# Patient Record
Sex: Female | Born: 1990 | Race: Black or African American | Hispanic: No | Marital: Single | Smoking: Never smoker
Health system: Southern US, Community
[De-identification: ages and names within clinical notes are randomized; demographics above are authoritative.]

## PROBLEM LIST (undated history)

## (undated) DIAGNOSIS — E0590 Thyrotoxicosis, unspecified without thyrotoxic crisis or storm: Secondary | ICD-10-CM

## (undated) DIAGNOSIS — F32A Depression, unspecified: Secondary | ICD-10-CM

## (undated) DIAGNOSIS — R519 Headache, unspecified: Secondary | ICD-10-CM

## (undated) HISTORY — DX: Headache, unspecified: R51.9

## (undated) HISTORY — DX: Thyrotoxicosis, unspecified without thyrotoxic crisis or storm: E05.90

---
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
## 2009-10-16 ENCOUNTER — Inpatient Hospital Stay (HOSPITAL_COMMUNITY): Admission: AD | Admit: 2009-10-16 | Discharge: 2009-10-16 | Payer: Self-pay | Admitting: Obstetrics and Gynecology

## 2009-10-16 ENCOUNTER — Ambulatory Visit: Payer: Self-pay | Admitting: Nurse Practitioner

## 2009-10-17 ENCOUNTER — Inpatient Hospital Stay (HOSPITAL_COMMUNITY): Admission: AD | Admit: 2009-10-17 | Discharge: 2009-10-17 | Payer: Self-pay | Admitting: Obstetrics and Gynecology

## 2009-10-17 ENCOUNTER — Ambulatory Visit: Payer: Self-pay | Admitting: Obstetrics and Gynecology

## 2010-05-17 ENCOUNTER — Encounter (HOSPITAL_COMMUNITY): Payer: Self-pay | Admitting: Obstetrics and Gynecology

## 2010-05-17 ENCOUNTER — Inpatient Hospital Stay (HOSPITAL_COMMUNITY)
Admission: RE | Admit: 2010-05-17 | Discharge: 2010-05-19 | DRG: 775 | Disposition: A | Discharge status: Home / Self Care | Payer: 59 | Source: Ambulatory Visit | Attending: Obstetrics and Gynecology | Admitting: Obstetrics and Gynecology

## 2010-05-17 DIAGNOSIS — D649 Anemia, unspecified: Secondary | ICD-10-CM | POA: Diagnosis not present

## 2010-05-17 DIAGNOSIS — O9903 Anemia complicating the puerperium: Secondary | ICD-10-CM | POA: Diagnosis not present

## 2010-05-17 LAB — CBC
HCT: 33.8 % — ABNORMAL LOW (ref 36.0–46.0)
Hemoglobin: 11.2 g/dL — ABNORMAL LOW (ref 12.0–15.0)
MCV: 86 fL (ref 78.0–100.0)
Platelets: 226 10*3/uL (ref 150–400)
RBC: 3.93 MIL/uL (ref 3.87–5.11)
WBC: 10.1 10*3/uL (ref 4.0–10.5)

## 2010-05-18 LAB — CBC
HCT: 27.5 % — ABNORMAL LOW (ref 36.0–46.0)
Hemoglobin: 8.9 g/dL — ABNORMAL LOW (ref 12.0–15.0)
MCH: 28.1 pg (ref 26.0–34.0)
MCHC: 32.4 g/dL (ref 30.0–36.0)
MCV: 86.8 fL (ref 78.0–100.0)
Platelets: 196 10*3/uL (ref 150–400)
RBC: 3.17 MIL/uL — ABNORMAL LOW (ref 3.87–5.11)
WBC: 11.2 10*3/uL — ABNORMAL HIGH (ref 4.0–10.5)

## 2010-05-29 NOTE — H&P (Signed)
  NAMEALISSE, [name] NO.:  [id number]  MEDICAL RECORD NO.:  [id number]           PATIENT TYPE:  I  LOCATION:  9162                          FACILITY:  [facility]  PHYSICIAN:  Janine Limbo, M.D.DATE OF BIRTH:  November 13, 1990  DATE OF ADMISSION:  05/17/2010 DATE OF DISCHARGE:                             HISTORY & PHYSICAL   HISTORY OF PRESENT ILLNESS:  [name] is a 20 year old female, gravida 1, para 0, who presents at 40 weeks and 5 days gestation ([facility] is May 12, 2010).  The patient has been followed at the [facility] of [facility] for women.  Her pregnancy has been largely uncomplicated.  She does have a history of pyelonephritis but has done well during this pregnancy.  Her third trimester beta strep test was positive.  DRUG ALLERGIES:  No known drug allergies.  PAST MEDICAL HISTORY:  The patient has a history of pyelonephritis as mentioned above.  She has done well during this pregnancy.  She was treated at the [facility] in July 2011, with IV antibiotics.  The patient was told that she had asthma as a child but has had no problems as an adult.  SOCIAL HISTORY:  The patient denies cigarette use, alcohol use, and recreational drug use.  REVIEW OF SYSTEMS:  Normal pregnancy complaints.  FAMILY HISTORY:  Noncontributory.  PHYSICAL EXAMINATION:  VITAL SIGNS:  Weight is 161 pounds, height is 5 feet 2 inches. HEENT:  Within normal limits. CHEST:  Clear. HEART:  Regular rate and rhythm. BREASTS:  Without masses. ABDOMEN:  Gravid with a fundal height of 40 cm. EXTREMITIES:  Grossly normal. NEUROLOGIC:  Grossly normal. PELVIC:  The cervix is 3-cm dilated, 75% effaced, and 0 to -1 in station.  LABORATORY VALUES:  Blood type is A positive, antibody screen negative, sickle cell negative, VDRL nonreactive, rubella immune, HbsAg negative, HIV nonreactive, GC negative, Chlamydia negative.   First trimester screen is within normal limits.  Third trimester beta strep is positive. Third trimester gonorrhea negative.  Third trimester Chlamydia is negative.  ASSESSMENT: 1. A 40-week and 5-day gestation. 2. Positive vaginal beta strep.  PLAN:  The patient has elected to proceed with induction at this time. She understands the indications and the risk associated with her induction protocol.  We will treat the patient with penicillin because of her positive beta strep test.     Janine Limbo, M.D.     AVS/MEDQ  D:  05/16/2010  T:  05/17/2010  Job:  [id number]  Electronically Signed by Kirkland Hun M.D. on 05/29/2010 11:00:19 AM

## 2010-06-18 LAB — DIFFERENTIAL
Basophils Absolute: 0 10*3/uL (ref 0.0–0.1)
Basophils Relative: 0 % (ref 0–1)
Eosinophils Absolute: 0 10*3/uL (ref 0.0–0.7)
Eosinophils Relative: 0 % (ref 0–5)
Lymphocytes Relative: 18 % (ref 12–46)
Lymphs Abs: 1.8 10*3/uL (ref 0.7–4.0)
Monocytes Absolute: 0.6 10*3/uL (ref 0.1–1.0)
Monocytes Relative: 6 % (ref 3–12)
Neutro Abs: 7.4 10*3/uL (ref 1.7–7.7)
Neutrophils Relative %: 75 % (ref 43–77)

## 2010-06-18 LAB — URINE MICROSCOPIC-ADD ON

## 2010-06-18 LAB — URINALYSIS, ROUTINE W REFLEX MICROSCOPIC
Bilirubin Urine: NEGATIVE
Glucose, UA: NEGATIVE mg/dL
Ketones, ur: NEGATIVE mg/dL
Nitrite: POSITIVE — AB
Protein, ur: 30 mg/dL — AB
Specific Gravity, Urine: 1.025 (ref 1.005–1.030)
Urobilinogen, UA: 0.2 mg/dL (ref 0.0–1.0)
pH: 6 (ref 5.0–8.0)

## 2010-06-18 LAB — BASIC METABOLIC PANEL
BUN: 5 mg/dL — ABNORMAL LOW (ref 6–23)
CO2: 24 mEq/L (ref 19–32)
Calcium: 10.3 mg/dL (ref 8.4–10.5)
Chloride: 101 mEq/L (ref 96–112)
Creatinine, Ser: 0.33 mg/dL — ABNORMAL LOW (ref 0.4–1.2)
GFR calc Af Amer: >60 mL/min (ref >60)
GFR calc non Af Amer: >60 mL/min (ref >60)
Glucose, Bld: 93 mg/dL (ref 70–99)
Potassium: 3.4 mEq/L — ABNORMAL LOW (ref 3.5–5.1)
Sodium: 132 mEq/L — ABNORMAL LOW (ref 135–145)

## 2010-06-18 LAB — CBC
HCT: 38.8 % (ref 36.0–46.0)
Hemoglobin: 13.2 g/dL (ref 12.0–15.0)
MCH: 30 pg (ref 26.0–34.0)
MCHC: 33.9 g/dL (ref 30.0–36.0)
MCV: 88.4 fL (ref 78.0–100.0)
Platelets: 237 10*3/uL (ref 150–400)
RBC: 4.39 MIL/uL (ref 3.87–5.11)
RDW: 14.1 % (ref 11.5–15.5)
WBC: 9.9 10*3/uL (ref 4.0–10.5)

## 2011-08-15 ENCOUNTER — Encounter: Payer: Self-pay | Admitting: Obstetrics and Gynecology

## 2012-04-03 HISTORY — PX: OTHER SURGICAL HISTORY: SHX169

## 2014-02-02 ENCOUNTER — Encounter (HOSPITAL_COMMUNITY): Payer: Self-pay | Admitting: Obstetrics and Gynecology

## 2017-11-27 DIAGNOSIS — I10 Essential (primary) hypertension: Secondary | ICD-10-CM | POA: Diagnosis not present

## 2018-01-01 DIAGNOSIS — E559 Vitamin D deficiency, unspecified: Secondary | ICD-10-CM | POA: Diagnosis not present

## 2018-01-01 DIAGNOSIS — I10 Essential (primary) hypertension: Secondary | ICD-10-CM | POA: Diagnosis not present

## 2018-01-01 DIAGNOSIS — Z23 Encounter for immunization: Secondary | ICD-10-CM | POA: Diagnosis not present

## 2018-01-01 DIAGNOSIS — Z0000 Encounter for general adult medical examination without abnormal findings: Secondary | ICD-10-CM | POA: Diagnosis not present

## 2018-05-22 DIAGNOSIS — I10 Essential (primary) hypertension: Secondary | ICD-10-CM | POA: Diagnosis not present

## 2018-07-17 DIAGNOSIS — Z23 Encounter for immunization: Secondary | ICD-10-CM | POA: Diagnosis not present

## 2018-08-14 DIAGNOSIS — Z23 Encounter for immunization: Secondary | ICD-10-CM | POA: Diagnosis not present

## 2018-11-13 DIAGNOSIS — F3341 Major depressive disorder, recurrent, in partial remission: Secondary | ICD-10-CM | POA: Diagnosis not present

## 2018-11-13 DIAGNOSIS — E559 Vitamin D deficiency, unspecified: Secondary | ICD-10-CM | POA: Diagnosis not present

## 2018-11-13 DIAGNOSIS — I10 Essential (primary) hypertension: Secondary | ICD-10-CM | POA: Diagnosis not present

## 2018-11-28 DIAGNOSIS — U071 COVID-19: Secondary | ICD-10-CM | POA: Diagnosis not present

## 2018-12-05 DIAGNOSIS — U071 COVID-19: Secondary | ICD-10-CM | POA: Diagnosis not present

## 2018-12-11 DIAGNOSIS — U071 COVID-19: Secondary | ICD-10-CM | POA: Diagnosis not present

## 2018-12-19 DIAGNOSIS — U071 COVID-19: Secondary | ICD-10-CM | POA: Diagnosis not present

## 2019-01-02 DIAGNOSIS — U071 COVID-19: Secondary | ICD-10-CM | POA: Diagnosis not present

## 2019-01-02 DIAGNOSIS — Z20828 Contact with and (suspected) exposure to other viral communicable diseases: Secondary | ICD-10-CM | POA: Diagnosis not present

## 2019-01-08 DIAGNOSIS — Z20828 Contact with and (suspected) exposure to other viral communicable diseases: Secondary | ICD-10-CM | POA: Diagnosis not present

## 2019-01-15 DIAGNOSIS — Z20828 Contact with and (suspected) exposure to other viral communicable diseases: Secondary | ICD-10-CM | POA: Diagnosis not present

## 2019-01-15 DIAGNOSIS — U071 COVID-19: Secondary | ICD-10-CM | POA: Diagnosis not present

## 2019-01-23 DIAGNOSIS — Z20828 Contact with and (suspected) exposure to other viral communicable diseases: Secondary | ICD-10-CM | POA: Diagnosis not present

## 2019-01-23 DIAGNOSIS — U071 COVID-19: Secondary | ICD-10-CM | POA: Diagnosis not present

## 2019-01-30 DIAGNOSIS — U071 COVID-19: Secondary | ICD-10-CM | POA: Diagnosis not present

## 2019-01-30 DIAGNOSIS — Z20828 Contact with and (suspected) exposure to other viral communicable diseases: Secondary | ICD-10-CM | POA: Diagnosis not present

## 2019-02-06 DIAGNOSIS — Z20828 Contact with and (suspected) exposure to other viral communicable diseases: Secondary | ICD-10-CM | POA: Diagnosis not present

## 2019-02-13 DIAGNOSIS — Z20828 Contact with and (suspected) exposure to other viral communicable diseases: Secondary | ICD-10-CM | POA: Diagnosis not present

## 2019-02-13 DIAGNOSIS — U071 COVID-19: Secondary | ICD-10-CM | POA: Diagnosis not present

## 2019-02-17 DIAGNOSIS — U071 COVID-19: Secondary | ICD-10-CM | POA: Diagnosis not present

## 2019-02-17 DIAGNOSIS — Z20828 Contact with and (suspected) exposure to other viral communicable diseases: Secondary | ICD-10-CM | POA: Diagnosis not present

## 2019-02-24 DIAGNOSIS — U071 COVID-19: Secondary | ICD-10-CM | POA: Diagnosis not present

## 2019-02-24 DIAGNOSIS — Z20828 Contact with and (suspected) exposure to other viral communicable diseases: Secondary | ICD-10-CM | POA: Diagnosis not present

## 2019-03-03 DIAGNOSIS — U071 COVID-19: Secondary | ICD-10-CM | POA: Diagnosis not present

## 2019-03-03 DIAGNOSIS — Z20828 Contact with and (suspected) exposure to other viral communicable diseases: Secondary | ICD-10-CM | POA: Diagnosis not present

## 2019-03-04 DIAGNOSIS — I10 Essential (primary) hypertension: Secondary | ICD-10-CM | POA: Diagnosis not present

## 2019-03-04 DIAGNOSIS — F3341 Major depressive disorder, recurrent, in partial remission: Secondary | ICD-10-CM | POA: Diagnosis not present

## 2019-03-04 DIAGNOSIS — E559 Vitamin D deficiency, unspecified: Secondary | ICD-10-CM | POA: Diagnosis not present

## 2019-03-07 DIAGNOSIS — I10 Essential (primary) hypertension: Secondary | ICD-10-CM | POA: Diagnosis not present

## 2019-03-07 DIAGNOSIS — E559 Vitamin D deficiency, unspecified: Secondary | ICD-10-CM | POA: Diagnosis not present

## 2019-03-10 DIAGNOSIS — U071 COVID-19: Secondary | ICD-10-CM | POA: Diagnosis not present

## 2019-03-10 DIAGNOSIS — Z20828 Contact with and (suspected) exposure to other viral communicable diseases: Secondary | ICD-10-CM | POA: Diagnosis not present

## 2019-03-17 DIAGNOSIS — U071 COVID-19: Secondary | ICD-10-CM | POA: Diagnosis not present

## 2019-03-17 DIAGNOSIS — Z20828 Contact with and (suspected) exposure to other viral communicable diseases: Secondary | ICD-10-CM | POA: Diagnosis not present

## 2019-03-24 DIAGNOSIS — Z20828 Contact with and (suspected) exposure to other viral communicable diseases: Secondary | ICD-10-CM | POA: Diagnosis not present

## 2019-03-24 DIAGNOSIS — U071 COVID-19: Secondary | ICD-10-CM | POA: Diagnosis not present

## 2020-10-05 DIAGNOSIS — E0591 Thyrotoxicosis, unspecified with thyrotoxic crisis or storm: Secondary | ICD-10-CM

## 2020-10-05 HISTORY — DX: Thyrotoxicosis, unspecified with thyrotoxic crisis or storm: E05.91

## 2020-10-05 LAB — OB RESULTS CONSOLE HEPATITIS B SURFACE ANTIGEN: Hepatitis B Surface Ag: NEGATIVE

## 2020-10-05 LAB — HEPATITIS C ANTIBODY: HCV Ab: NEGATIVE

## 2020-10-05 LAB — OB RESULTS CONSOLE RUBELLA ANTIBODY, IGM: Rubella: IMMUNE

## 2020-10-05 LAB — OB RESULTS CONSOLE ABO/RH: RH Type: POSITIVE

## 2020-10-05 LAB — OB RESULTS CONSOLE ANTIBODY SCREEN: Antibody Screen: NEGATIVE

## 2020-10-05 LAB — OB RESULTS CONSOLE HIV ANTIBODY (ROUTINE TESTING): HIV: NONREACTIVE

## 2020-10-05 LAB — OB RESULTS CONSOLE RPR: RPR: NONREACTIVE

## 2020-12-14 ENCOUNTER — Other Ambulatory Visit: Payer: Self-pay | Admitting: Gastroenterology

## 2020-12-14 DIAGNOSIS — R7989 Other specified abnormal findings of blood chemistry: Secondary | ICD-10-CM

## 2020-12-16 ENCOUNTER — Ambulatory Visit
Admission: RE | Admit: 2020-12-16 | Discharge: 2020-12-16 | Disposition: A | Discharge status: Home / Self Care | Payer: Self-pay | Source: Ambulatory Visit | Attending: Gastroenterology | Admitting: Gastroenterology

## 2020-12-16 ENCOUNTER — Ambulatory Visit (INDEPENDENT_AMBULATORY_CARE_PROVIDER_SITE_OTHER): Payer: 59 | Admitting: Cardiovascular Disease

## 2020-12-16 ENCOUNTER — Other Ambulatory Visit: Payer: Self-pay

## 2020-12-16 ENCOUNTER — Encounter (HOSPITAL_BASED_OUTPATIENT_CLINIC_OR_DEPARTMENT_OTHER): Payer: Self-pay | Admitting: Cardiovascular Disease

## 2020-12-16 DIAGNOSIS — Z331 Pregnant state, incidental: Secondary | ICD-10-CM

## 2020-12-16 DIAGNOSIS — R7989 Other specified abnormal findings of blood chemistry: Secondary | ICD-10-CM

## 2020-12-16 DIAGNOSIS — I10 Essential (primary) hypertension: Secondary | ICD-10-CM | POA: Diagnosis not present

## 2020-12-16 DIAGNOSIS — Z3490 Encounter for supervision of normal pregnancy, unspecified, unspecified trimester: Secondary | ICD-10-CM

## 2020-12-16 HISTORY — DX: Essential (primary) hypertension: I10

## 2020-12-16 HISTORY — DX: Encounter for supervision of normal pregnancy, unspecified, unspecified trimester: Z34.90

## 2020-12-16 NOTE — Progress Notes (Signed)
Advanced Hypertension Clinic Initial Assessment:    Date:  12/16/2020   ID:  [name], DOB 07/14/1990, MRN [id number]  PCP:  Soundra Pilon, FNP  Cardiologist:  None  Nephrologist:  Referring MD: Maxie Better, MD   CC: Hypertension  History of Present Illness:    [name] is a 30 y.o. female with a hx of hypertension in pregnancy, here to establish care in the Advanced Hypertension Clinic. She saw Dr. Cherly Hensen 10/2020 and her blood pressure was 110/74. She was on propranolol and amlodipine at that time.  Today, she is 5 months pregnant and due 05/03/2021. Her issues with blood pressure began 2-3 years ago. She was started on 10mg  amlodipine and 80mg  propranolol. Propranolol was then increased to 120 mg. She denies blood pressure issues during her first pregnancy. Since the third month of her current pregnancy, she has not been taking blood pressure medication. When she did take her medication she felt like her blood pressure would bottom out. Lately her blood pressure has been 110 systolic at the highest, and diastolic pressure is typically in the 60s. As of last week, she has new issues with lightheadedness/dizziness. During these episodes her heart rate is in the 100s-110s. At one time she felt near-syncopal but notes being overheated. She endorses mild LE edema that dissipates with LE elevation.  She notes some labored breathing walking up stairs, but nothing she considers abnormal considering her pregnancy. She works as a in and is usually active. Recently she has been ordering out more often. Previously she normally cooked meals at home and did monitor her salt intake. Typically drinks caffeinated beverages twice a week, no alcohol recently. No supplements or herbs, and she does not take pain medication while she has been pregnant. Otherwise she would take Aleve once a month for pain management. In 10/2020 Dr. Editor, commissioning noticed a goiter. Her TSH was in normal range.  Liver function labs were elevated. She has a GI consult after this appointment. She denies any palpitations, chest pain, headaches, orthopnea, or PND.   Previous antihypertensives: Amlodipine Propranolol   Past Medical History:  Diagnosis Date   Essential hypertension 12/16/2020   Pregnancy 12/16/2020    History reviewed. No pertinent surgical history.  Current Medications: Current Meds  Medication Sig   aspirin EC 81 MG tablet Take 81 mg by mouth daily. Swallow whole.   Prenatal Vit-Fe Fumarate-FA (PRENATAL MULTIVITAMIN) TABS tablet Take 1 tablet by mouth daily.     Allergies:   Patient has no allergy information on record.   Social History   Socioeconomic History   Marital status: Single    Spouse name: Not on file   Number of children: Not on file   Years of education: Not on file   Highest education level: Not on file  Occupational History   Not on file  Tobacco Use   Smoking status: Never   Smokeless tobacco: Never  Substance and Sexual Activity   Alcohol use: Never   Drug use: Never   Sexual activity: Not on file  Other Topics Concern   Not on file  Social History Narrative   Not on file   Social Determinants of Health   Financial Resource Strain: Low Risk    Difficulty of Paying Living Expenses: Not hard at all  Food Insecurity: No Food Insecurity   Worried About Running Out of Food in the Last Year: Never true   Ran Out of Food in the Last Year: Never true  Transportation Needs: No Regulatory affairs officer (Medical): No   Lack of Transportation (Non-Medical): No  Physical Activity: Not on file  Stress: Not on file  Social Connections: Not on file     Family History: The patient's family history includes Heart failure in her paternal grandmother; Hypertension in her father, maternal grandmother, and paternal grandmother; Stroke in her paternal grandmother.  ROS:   Please see the history of present illness.    (+)  Lightheadedness (+) Near-syncope (+) Shortness of breath (+) Bilateral LE edema All other systems reviewed and are negative.  EKGs/Labs/Other Studies Reviewed:    EKG:   12/16/2020: Sinus rhythm. Rate 77 bpm.  Recent Labs: No results found for requested labs within last 8760 hours.   Recent Lipid Panel No results found for: CHOL, TRIG, HDL, CHOLHDL, VLDL, LDLCALC, LDLDIRECT  Physical Exam:   VS:  BP 106/70   Pulse 77   Wt 170 lb (77.1 kg)  , BMI There is no height or weight on file to calculate BMI. GENERAL:  Well appearing HEENT: Pupils equal round and reactive, fundi not visualized, oral mucosa unremarkable NECK:  No jugular venous distention, waveform within normal limits, carotid upstroke brisk and symmetric, no bruits, + thyromegaly LUNGS:  Clear to auscultation bilaterally HEART:  RRR.  PMI not displaced or sustained,S1 and S2 within normal limits, no S3, no S4, no clicks, no rubs, no murmurs ABD:  Gravid uterus.  Positive bowel sounds normal in frequency in pitch, no bruits, no rebound, no guarding, no midline pulsatile mass, no hepatomegaly, no splenomegaly EXT:  2 plus pulses throughout, no edema, no cyanosis no clubbing SKIN:  No rashes no nodules NEURO:  Cranial nerves II through XII grossly intact, motor grossly intact throughout PSYCH:  Cognitively intact, oriented to person place and time   ASSESSMENT/PLAN:    Essential hypertension Blood pressure is currently well-controlled.  She was diagnosed with hypertension in her late 101s and was well-controlled on amlodipine and propranolol.  Since becoming pregnant she has not needed any medications and her blood pressure has remained low.  She is at risk of increasing during the third trimester and developing preeclampsia.  She is already appropriately on aspirin 81 mg.  She will continue to track her blood pressures at home.  Continue to limit sodium intake.  We will see her again in the third trimester to ensure that she  is stable and she understands she can call us anytime if her blood pressure starts to increase.  Given that she developed hypertension at such a young age, she should get a work-up for secondary causes, though very likely may be familial.  Once she delivers we will check renal artery Dopplers.  We can assess for hyperaldosteronism at that time as well.  She reportedly had thyroid function testing that was normal with Dr. Cherly Hensen in July.  Pregnancy 5 months   Screening for Secondary Hypertension:  Causes 12/16/2020  Drugs/Herbals Screened     - Comments Rare Aleve when not pregnant  Renovascular HTN Not Screened  Sleep Apnea N/A  Thyroid Disease Screened     - Comments TSH normal 10/2020  Hyperaldosteronism Not Screened  Pheochromocytoma N/A  Cushing's Syndrome N/A  Hyperparathyroidism N/A  Coarctation of the Aorta Screened     - Comments BP symmetric  Compliance Screened    Relevant Labs/Studies: Basic Labs Latest Ref Rng & Units 10/16/2009  Sodium 135 - 145 mEq/L 132(L)  Potassium 3.5 - 5.1 mEq/L 3.4(L)  Creatinine 0.4 - 1.2 mg/dL 4.01(U)                    Disposition:    FU with Ishaaq Penna C. Duke Salvia, MD, [facility] in 2-3 months.   Medication Adjustments/Labs and Tests Ordered: Current medicines are reviewed at length with the patient today.  Concerns regarding medicines are outlined above.  Orders Placed This Encounter  Procedures   EKG 12-Lead    No orders of the defined types were placed in this encounter.  I,Mathew Stumpf,acting as a Neurosurgeon for Chilton Si, MD.,have documented all relevant documentation on the behalf of Chilton Si, MD,as directed by  Chilton Si, MD while in the presence of Chilton Si, MD.  I, Loel Betancur C. Duke Salvia, MD have reviewed all documentation for this visit.  The documentation of the exam, diagnosis, procedures, and orders on 12/16/2020 are all accurate and complete.   Guadalupe Maple  12/16/2020 8:47 AM    [facility]

## 2020-12-16 NOTE — Assessment & Plan Note (Addendum)
Blood pressure is currently well-controlled.  She was diagnosed with hypertension in her late 64s and was well-controlled on amlodipine and propranolol.  Since becoming pregnant she has not needed any medications and her blood pressure has remained low.  She is at risk of increasing during the third trimester and developing preeclampsia.  She is already appropriately on aspirin 81 mg.  She will continue to track her blood pressures at home.  Continue to limit sodium intake.  We will see her again in the third trimester to ensure that she is stable and she understands she can call us anytime if her blood pressure starts to increase.  Given that she developed hypertension at such a young age, she should get a work-up for secondary causes, though very likely may be familial.  Once she delivers we will check renal artery Dopplers.  We can assess for hyperaldosteronism at that time as well.  She reportedly had thyroid function testing that was normal with Dr. Cherly Hensen in July.  Her BP goal is <140/90.

## 2020-12-16 NOTE — Patient Instructions (Addendum)
Medication Instructions:  Your physician recommends that you continue on your current medications as directed. Please refer to the Current Medication list given to you today.   Labwork: NONE    Testing/Procedures: NONE   Follow-Up: 02/15/2021 8:00 AM WITH DR [place] AT [facility]

## 2020-12-16 NOTE — Assessment & Plan Note (Addendum)
5 months.  Doing well.   She is due to have a baby in January. No issues with her previous pregnancy 10 years ago.

## 2021-02-14 NOTE — Progress Notes (Incomplete)
Advanced Hypertension Clinic Initial Assessment:    Date:  02/15/2021   ID:  [name], DOB 1990/07/07, MRN [id number]  PCP:  Soundra Pilon, FNP  Cardiologist:  None  Nephrologist:  Referring MD: Soundra Pilon, FNP   CC: Hypertension  History of Present Illness:    [name] is a 30 y.o. female with a hx of hypertension in pregnancy, here to follow-up in the Advanced Hypertension Clinic.   She saw Dr. Cherly Hensen 10/2020 and her blood pressure was 110/74. She was on propranolol and amlodipine at that time.  At her last visit, she was 5 months pregnant and due 05/03/2021. Her issues with blood pressure began 2-3 years ago. She was started on 10mg  amlodipine and 80mg  propranolol which was then increased to 120 mg. She denied blood pressure issues during her first pregnancy. Since the third month of her current pregnancy, she had not been taking blood pressure medication. When she did take her medication she felt like her blood pressure would bottom out. Her blood pressure had been 110 systolic at the highest, and diastolic pressure is typically in the 60s. She reported new issues with lightheadedness/dizziness with her heart rate increasing to 100s-110s. At one time she felt near-syncopal but notes being overheated. In 10/2020 Dr. noticed a goiter. Her TSH was in normal range. Liver function labs were elevated.   Today, she  She denies any palpitations, chest pain, or shortness of breath, lightheadedness, headaches, syncope, orthopnea, PND, lower extremity edema or exertional symptoms.  Previous antihypertensives: Amlodipine Propranolol   Past Medical History:  Diagnosis Date   Essential hypertension 12/16/2020   Pregnancy 12/16/2020    No past surgical history on file.  Current Medications: No outpatient medications have been marked as taking for the 02/15/21 encounter (Appointment) with 12/18/2020, MD.     Allergies:   Patient has no allergy information on  record.   Social History   Socioeconomic History   Marital status: Single    Spouse name: Not on file   Number of children: Not on file   Years of education: Not on file   Highest education level: Not on file  Occupational History   Not on file  Tobacco Use   Smoking status: Never   Smokeless tobacco: Never  Substance and Sexual Activity   Alcohol use: Never   Drug use: Never   Sexual activity: Not on file  Other Topics Concern   Not on file  Social History Narrative   Not on file   Social Determinants of Health   Financial Resource Strain: Low Risk    Difficulty of Paying Living Expenses: Not hard at all  Food Insecurity: No Food Insecurity   Worried About Running Out of Food in the Last Year: Never true   Ran Out of Food in the Last Year: Never true  Transportation Needs: No Transportation Needs   Lack of Transportation (Medical): No   Lack of Transportation (Non-Medical): No  Physical Activity: Not on file  Stress: Not on file  Social Connections: Not on file     Family History: The patient's family history includes Heart failure in her paternal grandmother; Hypertension in her father, maternal grandmother, and paternal grandmother; Stroke in her paternal grandmother.  ROS:   Please see the history of present illness.    All other systems reviewed and are negative.  EKGs/Labs/Other Studies Reviewed:    EKG:   02/14/21: Sinus ***, rate *** bpm 12/16/2020: Sinus rhythm. Rate 77  bpm.  Recent Labs: No results found for requested labs within last 8760 hours.   Recent Lipid Panel No results found for: CHOL, TRIG, HDL, CHOLHDL, VLDL, LDLCALC, LDLDIRECT  Physical Exam:   VS:  There were no vitals taken for this visit. , BMI There is no height or weight on file to calculate BMI. GENERAL:  Well appearing HEENT: Pupils equal round and reactive, fundi not visualized, oral mucosa unremarkable NECK:  No jugular venous distention, waveform within normal limits,  carotid upstroke brisk and symmetric, no bruits, *** + thyromegaly LUNGS:  Clear to auscultation bilaterally HEART:  RRR.  PMI not displaced or sustained,S1 and S2 within normal limits, no S3, no S4, no clicks, no rubs, no murmurs ABD:  Gravid uterus.  Positive bowel sounds normal in frequency in pitch, no bruits, no rebound, no guarding, no midline pulsatile mass, no hepatomegaly, no splenomegaly EXT:  2 plus pulses throughout, no edema, no cyanosis no clubbing SKIN:  No rashes no nodules NEURO:  Cranial nerves II through XII grossly intact, motor grossly intact throughout PSYCH:  Cognitively intact, oriented to person place and time   ASSESSMENT/PLAN:    No problem-specific Assessment & Plan notes found for this encounter.   Screening for Secondary Hypertension:  Causes 12/16/2020  Drugs/Herbals Screened     - Comments Rare Aleve when not pregnant  Renovascular HTN Not Screened  Sleep Apnea N/A  Thyroid Disease Screened     - Comments TSH normal 10/2020  Hyperaldosteronism Not Screened  Pheochromocytoma N/A  Cushing's Syndrome N/A  Hyperparathyroidism N/A  Coarctation of the Aorta Screened     - Comments BP symmetric  Compliance Screened    Relevant Labs/Studies: Basic Labs Latest Ref Rng & Units 10/16/2009  Sodium 135 - 145 mEq/L 132(L)  Potassium 3.5 - 5.1 mEq/L 3.4(L)  Creatinine 0.4 - 1.2 mg/dL 2.84(X)                    Disposition:    FU with Tiffany C. Duke Salvia, MD, [facility] in *** months.   Medication Adjustments/Labs and Tests Ordered: Current medicines are reviewed at length with the patient today.  Concerns regarding medicines are outlined above.  No orders of the defined types were placed in this encounter.   No orders of the defined types were placed in this encounter.   I,Mykaella Javier,acting as a scribe for Chilton Si, MD.,have documented all relevant documentation on the behalf of Chilton Si, MD,as directed by  Chilton Si, MD while  in the presence of Chilton Si, MD.  ***  Signed, Pieter Partridge  02/15/2021 7:47 AM    [facility]

## 2021-02-15 ENCOUNTER — Ambulatory Visit (HOSPITAL_BASED_OUTPATIENT_CLINIC_OR_DEPARTMENT_OTHER): Payer: 59 | Admitting: Cardiovascular Disease

## 2021-03-31 LAB — OB RESULTS CONSOLE GBS: GBS: NEGATIVE

## 2021-04-03 NOTE — L&D Delivery Note (Signed)
Delivery Note At 6:23 PM a viable and healthy female was delivered via  (Presentation:  vtx/OA    ).  APGAR: 8, 9; weight  pending.   Placenta status: Spontaneous, Intact.  Not sent. Cord:  CAN x1 reducible . Marginal cord 3 vessels with the following complications: None.  Cord pH: n/a  Anesthesia: Epidural Episiotomy: None Lacerations: None Suture Repair:  n/a Est. Blood Loss (mL):   Mom to postpartum.  Baby to Couplet care / Skin to Skin.  Keanon Bevins A Kazi Montoro 04/25/2021, 6:50 PM

## 2021-04-20 ENCOUNTER — Telehealth (HOSPITAL_COMMUNITY): Payer: Self-pay | Admitting: *Deleted

## 2021-04-20 ENCOUNTER — Encounter (HOSPITAL_COMMUNITY): Payer: Self-pay | Admitting: *Deleted

## 2021-04-20 NOTE — Telephone Encounter (Signed)
Preadmission screen  

## 2021-04-21 ENCOUNTER — Encounter (HOSPITAL_COMMUNITY): Payer: Self-pay | Admitting: *Deleted

## 2021-04-25 ENCOUNTER — Inpatient Hospital Stay (HOSPITAL_COMMUNITY): Payer: Commercial Managed Care - HMO | Admitting: Anesthesiology

## 2021-04-25 ENCOUNTER — Encounter (HOSPITAL_COMMUNITY): Payer: Self-pay | Admitting: Obstetrics and Gynecology

## 2021-04-25 ENCOUNTER — Inpatient Hospital Stay (HOSPITAL_COMMUNITY)
Admission: AD | Admit: 2021-04-25 | Discharge: 2021-04-27 | DRG: 807 | Disposition: A | Discharge status: Home / Self Care | Payer: Commercial Managed Care - HMO | Attending: Obstetrics and Gynecology | Admitting: Obstetrics and Gynecology

## 2021-04-25 ENCOUNTER — Other Ambulatory Visit: Payer: Self-pay

## 2021-04-25 DIAGNOSIS — O10013 Pre-existing essential hypertension complicating pregnancy, third trimester: Secondary | ICD-10-CM

## 2021-04-25 DIAGNOSIS — Z20822 Contact with and (suspected) exposure to covid-19: Secondary | ICD-10-CM | POA: Diagnosis present

## 2021-04-25 DIAGNOSIS — O114 Pre-existing hypertension with pre-eclampsia, complicating childbirth: Secondary | ICD-10-CM | POA: Diagnosis present

## 2021-04-25 DIAGNOSIS — O1002 Pre-existing essential hypertension complicating childbirth: Secondary | ICD-10-CM | POA: Diagnosis present

## 2021-04-25 DIAGNOSIS — Z3A38 38 weeks gestation of pregnancy: Secondary | ICD-10-CM | POA: Diagnosis not present

## 2021-04-25 HISTORY — DX: Pre-existing essential hypertension complicating pregnancy, third trimester: O10.013

## 2021-04-25 LAB — PROTEIN / CREATININE RATIO, URINE
Creatinine, Urine: 14.14 mg/dL
Total Protein, Urine: <6 mg/dL

## 2021-04-25 LAB — BASIC METABOLIC PANEL
Anion gap: 9 (ref 5–15)
BUN: 6 mg/dL (ref 6–20)
CO2: 20 mmol/L — ABNORMAL LOW (ref 22–32)
Calcium: 9.3 mg/dL (ref 8.9–10.3)
Chloride: 106 mmol/L (ref 98–111)
Creatinine, Ser: 0.45 mg/dL (ref 0.44–1.00)
GFR, Estimated: >60 mL/min (ref >60)
Glucose, Bld: 104 mg/dL — ABNORMAL HIGH (ref 70–99)
Potassium: 3.6 mmol/L (ref 3.5–5.1)
Sodium: 135 mmol/L (ref 135–145)

## 2021-04-25 LAB — CBC
HCT: 34.6 % — ABNORMAL LOW (ref 36.0–46.0)
Hemoglobin: 11.3 g/dL — ABNORMAL LOW (ref 12.0–15.0)
MCH: 28.5 pg (ref 26.0–34.0)
MCHC: 32.7 g/dL (ref 30.0–36.0)
MCV: 87.2 fL (ref 80.0–100.0)
Platelets: 242 10*3/uL (ref 150–400)
RBC: 3.97 MIL/uL (ref 3.87–5.11)
RDW: 14.2 % (ref 11.5–15.5)
WBC: 7.8 10*3/uL (ref 4.0–10.5)
nRBC: 0 % (ref 0.0–0.2)

## 2021-04-25 LAB — CBC WITH DIFFERENTIAL/PLATELET
Abs Immature Granulocytes: 0.05 10*3/uL (ref 0.00–0.07)
Basophils Absolute: 0 10*3/uL (ref 0.0–0.1)
Basophils Relative: 0 %
Eosinophils Absolute: 0 10*3/uL (ref 0.0–0.5)
Eosinophils Relative: 0 %
HCT: 34.3 % — ABNORMAL LOW (ref 36.0–46.0)
Hemoglobin: 11.6 g/dL — ABNORMAL LOW (ref 12.0–15.0)
Immature Granulocytes: 0 %
Lymphocytes Relative: 11 %
Lymphs Abs: 1.4 10*3/uL (ref 0.7–4.0)
MCH: 28.9 pg (ref 26.0–34.0)
MCHC: 33.8 g/dL (ref 30.0–36.0)
MCV: 85.5 fL (ref 80.0–100.0)
Monocytes Absolute: 0.7 10*3/uL (ref 0.1–1.0)
Monocytes Relative: 6 %
Neutro Abs: 10 10*3/uL — ABNORMAL HIGH (ref 1.7–7.7)
Neutrophils Relative %: 83 %
Platelets: 232 10*3/uL (ref 150–400)
RBC: 4.01 MIL/uL (ref 3.87–5.11)
RDW: 14.3 % (ref 11.5–15.5)
WBC: 12.1 10*3/uL — ABNORMAL HIGH (ref 4.0–10.5)
nRBC: 0 % (ref 0.0–0.2)

## 2021-04-25 LAB — MAGNESIUM: Magnesium: 4.3 mg/dL — ABNORMAL HIGH (ref 1.7–2.4)

## 2021-04-25 LAB — TYPE AND SCREEN
ABO/RH(D): A POS
Antibody Screen: NEGATIVE

## 2021-04-25 LAB — RESP PANEL BY RT-PCR (FLU A&B, COVID) ARPGX2
Influenza A by PCR: NEGATIVE
Influenza B by PCR: NEGATIVE
SARS Coronavirus 2 by RT PCR: NEGATIVE

## 2021-04-25 MED ORDER — LACTATED RINGERS IV SOLN
500.0000 mL | Freq: Once | INTRAVENOUS | Status: AC
  Administered 2021-04-25: 500 mL via INTRAVENOUS

## 2021-04-25 MED ORDER — LIDOCAINE HCL (PF) 1 % IJ SOLN: 30.0000 mL | INTRAMUSCULAR | Status: DC | PRN

## 2021-04-25 MED ORDER — SENNOSIDES-DOCUSATE SODIUM 8.6-50 MG PO TABS
2.0000 | ORAL_TABLET | Freq: Every day | ORAL | Status: DC
  Administered 2021-04-26 – 2021-04-27 (×2): 2 via ORAL
  Filled 2021-04-25 (×2): qty 2

## 2021-04-25 MED ORDER — ZOLPIDEM TARTRATE 5 MG PO TABS: 5.0000 mg | ORAL_TABLET | Freq: Every evening | ORAL | Status: DC | PRN

## 2021-04-25 MED ORDER — ACETAMINOPHEN 325 MG PO TABS: 650.0000 mg | ORAL_TABLET | ORAL | Status: DC | PRN

## 2021-04-25 MED ORDER — OXYCODONE HCL 5 MG PO TABS: 5.0000 mg | ORAL_TABLET | ORAL | Status: DC | PRN

## 2021-04-25 MED ORDER — LABETALOL HCL 5 MG/ML IV SOLN
INTRAVENOUS | Status: AC
  Filled 2021-04-25: qty 4

## 2021-04-25 MED ORDER — OXYCODONE-ACETAMINOPHEN 5-325 MG PO TABS: 2.0000 | ORAL_TABLET | ORAL | Status: DC | PRN

## 2021-04-25 MED ORDER — LACTATED RINGERS AMNIOINFUSION
INTRAVENOUS | Status: DC
  Filled 2021-04-25 (×2): qty 1000

## 2021-04-25 MED ORDER — EPHEDRINE 5 MG/ML INJ: 10.0000 mg | INTRAVENOUS | Status: DC | PRN

## 2021-04-25 MED ORDER — LIDOCAINE HCL (PF) 1 % IJ SOLN
INTRAMUSCULAR | Status: DC | PRN
  Administered 2021-04-25: 5 mL via EPIDURAL
  Administered 2021-04-25: 4 mL via EPIDURAL

## 2021-04-25 MED ORDER — OXYTOCIN-SODIUM CHLORIDE 30-0.9 UT/500ML-% IV SOLN: 2.5000 [IU]/h | INTRAVENOUS | Status: DC

## 2021-04-25 MED ORDER — SOD CITRATE-CITRIC ACID 500-334 MG/5ML PO SOLN: 30.0000 mL | ORAL | Status: DC | PRN

## 2021-04-25 MED ORDER — FENTANYL-BUPIVACAINE-NACL 0.5-0.125-0.9 MG/250ML-% EP SOLN
12.0000 mL/h | EPIDURAL | Status: DC | PRN
  Administered 2021-04-25: 12 mL/h via EPIDURAL
  Filled 2021-04-25: qty 250

## 2021-04-25 MED ORDER — OXYTOCIN 10 UNIT/ML IJ SOLN
10.0000 [IU] | Freq: Once | INTRAMUSCULAR | Status: DC
Start: 2021-04-25 — End: 2021-04-25

## 2021-04-25 MED ORDER — BENZOCAINE-MENTHOL 20-0.5 % EX AERO
1.0000 "application " | INHALATION_SPRAY | CUTANEOUS | Status: DC | PRN
  Administered 2021-04-26: 1 via TOPICAL
  Filled 2021-04-25: qty 56

## 2021-04-25 MED ORDER — LACTATED RINGERS IV SOLN: INTRAVENOUS | Status: DC

## 2021-04-25 MED ORDER — LABETALOL HCL 5 MG/ML IV SOLN
20.0000 mg | INTRAVENOUS | Status: DC | PRN
  Administered 2021-04-25: 20 mg via INTRAVENOUS

## 2021-04-25 MED ORDER — MAGNESIUM SULFATE BOLUS VIA INFUSION
4.0000 g | Freq: Once | INTRAVENOUS | Status: AC
  Administered 2021-04-25: 4 g via INTRAVENOUS
  Filled 2021-04-25: qty 1000

## 2021-04-25 MED ORDER — FERROUS SULFATE 325 (65 FE) MG PO TABS
325.0000 mg | ORAL_TABLET | Freq: Two times a day (BID) | ORAL | Status: DC
  Administered 2021-04-26 – 2021-04-27 (×3): 325 mg via ORAL
  Filled 2021-04-25 (×3): qty 1

## 2021-04-25 MED ORDER — OXYCODONE HCL 5 MG PO TABS: 10.0000 mg | ORAL_TABLET | ORAL | Status: DC | PRN

## 2021-04-25 MED ORDER — DIPHENHYDRAMINE HCL 50 MG/ML IJ SOLN: 12.5000 mg | INTRAMUSCULAR | Status: DC | PRN

## 2021-04-25 MED ORDER — LABETALOL HCL 5 MG/ML IV SOLN: 80.0000 mg | INTRAVENOUS | Status: DC | PRN

## 2021-04-25 MED ORDER — MAGNESIUM SULFATE 40 GM/1000ML IV SOLN
2.0000 g/h | INTRAVENOUS | Status: AC
  Administered 2021-04-26: 04:00:00 2 g/h via INTRAVENOUS
  Filled 2021-04-25 (×2): qty 1000

## 2021-04-25 MED ORDER — PHENYLEPHRINE 40 MCG/ML (10ML) SYRINGE FOR IV PUSH (FOR BLOOD PRESSURE SUPPORT): 80.0000 ug | PREFILLED_SYRINGE | INTRAVENOUS | Status: DC | PRN

## 2021-04-25 MED ORDER — PRENATAL MULTIVITAMIN CH
1.0000 | ORAL_TABLET | Freq: Every day | ORAL | Status: DC
  Administered 2021-04-26: 12:00:00 1 via ORAL
  Filled 2021-04-25: qty 1

## 2021-04-25 MED ORDER — HYDRALAZINE HCL 20 MG/ML IJ SOLN: 10.0000 mg | INTRAMUSCULAR | Status: DC | PRN

## 2021-04-25 MED ORDER — DIBUCAINE (PERIANAL) 1 % EX OINT: 1.0000 "application " | TOPICAL_OINTMENT | CUTANEOUS | Status: DC | PRN

## 2021-04-25 MED ORDER — PHENYLEPHRINE 40 MCG/ML (10ML) SYRINGE FOR IV PUSH (FOR BLOOD PRESSURE SUPPORT)
80.0000 ug | PREFILLED_SYRINGE | INTRAVENOUS | Status: DC | PRN
  Filled 2021-04-25: qty 10

## 2021-04-25 MED ORDER — COCONUT OIL OIL
1.0000 "application " | TOPICAL_OIL | Status: DC | PRN
  Administered 2021-04-26: 1 via TOPICAL

## 2021-04-25 MED ORDER — LABETALOL HCL 100 MG PO TABS
100.0000 mg | ORAL_TABLET | Freq: Two times a day (BID) | ORAL | Status: DC
  Administered 2021-04-25 – 2021-04-27 (×5): 100 mg via ORAL
  Filled 2021-04-25 (×5): qty 1

## 2021-04-25 MED ORDER — SERTRALINE HCL 50 MG PO TABS
25.0000 mg | ORAL_TABLET | Freq: Every day | ORAL | Status: DC
Start: 2021-04-25 — End: 2021-04-27
  Administered 2021-04-25 – 2021-04-26 (×2): 25 mg via ORAL
  Filled 2021-04-25 (×2): qty 1

## 2021-04-25 MED ORDER — OXYTOCIN BOLUS FROM INFUSION
333.0000 mL | Freq: Once | INTRAVENOUS | Status: AC
  Administered 2021-04-25: 333 mL via INTRAVENOUS

## 2021-04-25 MED ORDER — ONDANSETRON HCL 4 MG/2ML IJ SOLN: 4.0000 mg | INTRAMUSCULAR | Status: DC | PRN

## 2021-04-25 MED ORDER — OXYTOCIN-SODIUM CHLORIDE 30-0.9 UT/500ML-% IV SOLN
1.0000 m[IU]/min | INTRAVENOUS | Status: DC
  Administered 2021-04-25: 2 m[IU]/min via INTRAVENOUS
  Filled 2021-04-25: qty 500

## 2021-04-25 MED ORDER — LACTATED RINGERS IV SOLN: 500.0000 mL | INTRAVENOUS | Status: DC | PRN

## 2021-04-25 MED ORDER — WITCH HAZEL-GLYCERIN EX PADS: 1.0000 "application " | MEDICATED_PAD | CUTANEOUS | Status: DC | PRN

## 2021-04-25 MED ORDER — LABETALOL HCL 5 MG/ML IV SOLN: 40.0000 mg | INTRAVENOUS | Status: DC | PRN

## 2021-04-25 MED ORDER — TERBUTALINE SULFATE 1 MG/ML IJ SOLN: 0.2500 mg | Freq: Once | INTRAMUSCULAR | Status: DC | PRN

## 2021-04-25 MED ORDER — OXYCODONE-ACETAMINOPHEN 5-325 MG PO TABS: 1.0000 | ORAL_TABLET | ORAL | Status: DC | PRN

## 2021-04-25 MED ORDER — IBUPROFEN 600 MG PO TABS
600.0000 mg | ORAL_TABLET | Freq: Four times a day (QID) | ORAL | Status: DC
  Administered 2021-04-25 – 2021-04-26 (×3): 600 mg via ORAL
  Filled 2021-04-25 (×3): qty 1

## 2021-04-25 MED ORDER — ONDANSETRON HCL 4 MG PO TABS: 4.0000 mg | ORAL_TABLET | ORAL | Status: DC | PRN

## 2021-04-25 MED ORDER — SIMETHICONE 80 MG PO CHEW: 80.0000 mg | CHEWABLE_TABLET | ORAL | Status: DC | PRN

## 2021-04-25 MED ORDER — DIPHENHYDRAMINE HCL 25 MG PO CAPS: 25.0000 mg | ORAL_CAPSULE | Freq: Four times a day (QID) | ORAL | Status: DC | PRN

## 2021-04-25 MED ORDER — ONDANSETRON HCL 4 MG/2ML IJ SOLN: 4.0000 mg | Freq: Four times a day (QID) | INTRAMUSCULAR | Status: DC | PRN

## 2021-04-25 NOTE — MAU Provider Note (Signed)
MAU MSE note  S Ms. Sherald Balbuena is a 31 y.o. G2P1001 patient who presents to MAU. She is a patient of Dr Cherly Hensen and was sent over due to nonreactive NST. Per nursing stff, suppose to be admitted to labor floor for induction.  O BP (!) 153/87 (BP Location: Right Arm)    Pulse 85    Temp 98.1 F (36.7 C) (Oral)    Resp 18    Ht 5\' 2"  (1.575 m)    Wt 82.1 kg    SpO2 99%    BMI 33.12 kg/m  Physical Exam Vitals and nursing note reviewed. Exam conducted with a chaperone present.  Constitutional:      General: She is not in acute distress.    Appearance: Normal appearance.  Skin:    General: Skin is warm and dry.     Capillary Refill: Capillary refill takes less than 2 seconds.  Neurological:     General: No focal deficit present.     Mental Status: She is alert.  Psychiatric:        Mood and Affect: Mood normal.        Behavior: Behavior normal.        Thought Content: Thought content normal.        Judgment: Judgment normal.    A Medical screening exam complete   P Admit per Dr .  Cherly Hensen, DO 04/25/2021 10:55 AM

## 2021-04-25 NOTE — Progress Notes (Addendum)
S: comfortable  O: BP 125/74    Pulse 82    Temp 97.7 F (36.5 C) (Oral)    Resp 17    Ht 5\' 2"  (1.575 m)    Wt 82.1 kg    SpO2 98%    BMI 33.11 kg/m  Pitocin 10 MIU VE 9/90/+1 LOT AROM clear fluid scant/ IUPC placed due to variable decelerations Tracing: baseline  130 (+) variable decel to 60's Ctx q 3-5 mins  IMP; Variable decelerations Chronic HTN  3rd trim on Magnesium sulfate  IUP @ 38 6/7 wk P) amnioinfusion. Cowgirl position on mat right   Addendum: repetitive deep variable decelerations noted. Pt turn to left  Exam.: completel +1 with urge to push  Amnioinfusion bolus ongoing Start pushing Pitocin discontinued

## 2021-04-25 NOTE — Lactation Note (Signed)
This note was copied from a baby's chart. Lactation Consultation Note  Patient Name: [name] [id number] Date: 04/25/2021 Reason for consult: L&D Initial assessment;Mother's request;1st time breastfeeding;Early term 37-38.6wks;Breastfeeding assistance;Other (Comment) (GHTN (labetalol))  Age:33 hours LC assisted with latching infant in cross cradle with signs of milk transfer.  Mom feeding plan EBM and offer pumped breast milk in a bottle.   Mom to receive further LC support on the floor.  Maternal Data    Feeding Mother's Current Feeding Choice: Breast Milk  LATCH Score Latch: Repeated attempts needed to sustain latch, nipple held in mouth throughout feeding, stimulation needed to elicit sucking reflex.  Audible Swallowing: Spontaneous and intermittent  Type of Nipple: Everted at rest and after stimulation  Comfort (Breast/Nipple): Soft / non-tender  Hold (Positioning): Assistance needed to correctly position infant at breast and maintain latch.  LATCH Score: 8   Lactation Tools Discussed/Used    Interventions Interventions: Breast feeding basics reviewed;Assisted with latch;Skin to skin;Breast compression;Adjust position;Support pillows;Position options;Expressed milk;Education;Infant Driven Feeding Algorithm education  Discharge Pump: Personal WIC Program: No  Consult Status Consult Status: Follow-up from L&D Date: 04/26/21 Follow-up type: In-patient    Anael Rosch  Nicholson-Springer 04/25/2021, 7:16 PM
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
## 2021-04-25 NOTE — Anesthesia Preprocedure Evaluation (Signed)
Anesthesia Evaluation  Patient identified by MRN, date of birth, ID band Patient awake    Reviewed: Allergy & Precautions, NPO status , Patient's Chart, lab work & pertinent test results  History of Anesthesia Complications Negative for: history of anesthetic complications  Airway Mallampati: II   Neck ROM: Full    Dental   Pulmonary neg pulmonary ROS,    Pulmonary exam normal        Cardiovascular hypertension (chronic), Normal cardiovascular exam     Neuro/Psych  Headaches, PSYCHIATRIC DISORDERS Depression    GI/Hepatic negative GI ROS, Neg liver ROS,   Endo/Other   Obesity   Renal/GU negative Renal ROS     Musculoskeletal negative musculoskeletal ROS (+)   Abdominal   Peds  Hematology negative hematology ROS (+) anemia ,  Plt 242k    Anesthesia Other Findings   Reproductive/Obstetrics (+) Pregnancy                             Anesthesia Physical Anesthesia Plan  ASA: 2  Anesthesia Plan: Epidural   Post-op Pain Management:    Induction:   PONV Risk Score and Plan: 2 and Treatment may vary due to age or medical condition  Airway Management Planned: Natural Airway  Additional Equipment: None  Intra-op Plan:   Post-operative Plan:   Informed Consent: I have reviewed the patients History and Physical, chart, labs and discussed the procedure including the risks, benefits and alternatives for the proposed anesthesia with the patient or authorized representative who has indicated his/her understanding and acceptance.       Plan Discussed with: Anesthesiologist  Anesthesia Plan Comments: (Labs reviewed. Platelets acceptable, patient not taking any blood thinning medications. Per RN, FHR tracing reported to be stable enough for sitting procedure. Risks and benefits discussed with patient, including PDPH, backache, epidural hematoma, failed epidural, blood pressure changes,  allergic reaction, and nerve injury. Patient expressed understanding and wished to proceed.)        Anesthesia Quick Evaluation

## 2021-04-25 NOTE — Anesthesia Procedure Notes (Signed)
Epidural Patient location during procedure: OB Start time: 04/25/2021 1:58 PM End time: 04/25/2021 2:01 PM  Staffing Anesthesiologist: Beryle Lathe, MD Performed: anesthesiologist   Preanesthetic Checklist Completed: patient identified, IV checked, risks and benefits discussed, monitors and equipment checked, pre-op evaluation and timeout performed  Epidural Patient position: sitting Prep: DuraPrep Patient monitoring: continuous pulse ox and blood pressure Approach: midline Location: L2-L3 Injection technique: LOR saline  Needle:  Needle type: Tuohy  Needle gauge: 17 G Needle length: 9 cm Needle insertion depth: 5 cm Catheter size: 19 Gauge Catheter at skin depth: 10 cm Test dose: negative and Other (1% lidocaine)  Assessment Events: blood not aspirated  Additional Notes Patient identified. Risks including, but not limited to, bleeding, infection, nerve damage, paralysis, inadequate analgesia, blood pressure changes, nausea, vomiting, allergic reaction, postpartum back pain, itching, and headache were discussed. Patient expressed understanding and wished to proceed. Sterile prep and drape, including hand hygiene, mask, and sterile gloves were used. The patient was positioned and the spine was prepped. The skin was anesthetized with lidocaine. No paraesthesia or other complication noted. The patient did not experience any signs of intravascular injection such as tinnitus or metallic taste in mouth, nor signs of intrathecal spread such as rapid motor block. Please see nursing notes for vital signs. The patient tolerated the procedure well.   Leslye Peer, MDReason for block:procedure for pain

## 2021-04-25 NOTE — MAU Note (Signed)
Sent from office secondary non reactive NST in office.  Denies VB and LOF, has bloody show.  Endorses +FM.

## 2021-04-26 ENCOUNTER — Inpatient Hospital Stay (HOSPITAL_COMMUNITY)
Admission: AD | Admit: 2021-04-26 | Payer: Managed Care, Other (non HMO) | Source: Home / Self Care | Admitting: Obstetrics and Gynecology

## 2021-04-26 ENCOUNTER — Inpatient Hospital Stay (HOSPITAL_COMMUNITY): Payer: Managed Care, Other (non HMO)

## 2021-04-26 HISTORY — DX: Depression, unspecified: F32.A

## 2021-04-26 LAB — CBC
HCT: 31 % — ABNORMAL LOW (ref 36.0–46.0)
Hemoglobin: 10.5 g/dL — ABNORMAL LOW (ref 12.0–15.0)
MCH: 29.2 pg (ref 26.0–34.0)
MCHC: 33.9 g/dL (ref 30.0–36.0)
MCV: 86.1 fL (ref 80.0–100.0)
Platelets: 219 10*3/uL (ref 150–400)
RBC: 3.6 MIL/uL — ABNORMAL LOW (ref 3.87–5.11)
RDW: 14.5 % (ref 11.5–15.5)
WBC: 9.9 10*3/uL (ref 4.0–10.5)
nRBC: 0 % (ref 0.0–0.2)

## 2021-04-26 LAB — COMPREHENSIVE METABOLIC PANEL
ALT: 14 U/L (ref 0–44)
AST: 22 U/L (ref 15–41)
Albumin: 2.8 g/dL — ABNORMAL LOW (ref 3.5–5.0)
Alkaline Phosphatase: 103 U/L (ref 38–126)
Anion gap: 7 (ref 5–15)
BUN: <5 mg/dL — ABNORMAL LOW (ref 6–20)
CO2: 21 mmol/L — ABNORMAL LOW (ref 22–32)
Calcium: 7.5 mg/dL — ABNORMAL LOW (ref 8.9–10.3)
Chloride: 107 mmol/L (ref 98–111)
Creatinine, Ser: 0.4 mg/dL — ABNORMAL LOW (ref 0.44–1.00)
GFR, Estimated: >60 mL/min (ref >60)
Glucose, Bld: 108 mg/dL — ABNORMAL HIGH (ref 70–99)
Potassium: 3.6 mmol/L (ref 3.5–5.1)
Sodium: 135 mmol/L (ref 135–145)
Total Bilirubin: 0.3 mg/dL (ref 0.3–1.2)
Total Protein: 5.9 g/dL — ABNORMAL LOW (ref 6.5–8.1)

## 2021-04-26 LAB — RPR: RPR Ser Ql: NONREACTIVE

## 2021-04-26 MED ORDER — LACTATED RINGERS IV SOLN: INTRAVENOUS | Status: DC

## 2021-04-26 NOTE — Anesthesia Postprocedure Evaluation (Signed)
Anesthesia Post Note  Patient: Diondra Pines  Procedure(s) Performed: AN AD HOC LABOR EPIDURAL     Patient location during evaluation: OB High Risk Anesthesia Type: Epidural Level of consciousness: awake, oriented and awake and alert Pain management: pain level controlled Vital Signs Assessment: post-procedure vital signs reviewed and stable Respiratory status: spontaneous breathing, respiratory function stable and nonlabored ventilation Cardiovascular status: stable Postop Assessment: no headache, adequate PO intake, patient able to bend at knees, no backache, able to ambulate and no apparent nausea or vomiting Anesthetic complications: no   No notable events documented.  Last Vitals:  Vitals:   04/26/21 0650 04/26/21 0755  BP:  114/71  Pulse:  86  Resp: 18 18  Temp:  36.7 C  SpO2:  96%    Last Pain:  Vitals:   04/26/21 0755  TempSrc: Oral  PainSc:    Pain Goal:                   Riyanshi Wahab

## 2021-04-26 NOTE — Lactation Note (Addendum)
This note was copied from a baby's chart. Lactation Consultation Note  Patient Name: [name] [id number] Date: 04/26/2021 Reason for consult: Follow-up assessment;Maternal endocrine disorder Age:33 hours  P2, Baby has not latched since yesterday.  Baby has primarily been formula feeding. Baby sleeping. Encouraged breastfeeding before offering formula to help establish full milk supply. Provided volume guidelines. DEBP set up in room Reviewed milk storage. 24 flanges seem appropriate at this time. Encouraged prepumping and hand expressing before latching on R side which per mother has been more challenging to latch. Suggest calling if mother needs assistance with latching.   Maternal Data Has patient been taught Hand Expression?: Yes Does the patient have breastfeeding experience prior to this delivery?: Yes How long did the patient breastfeed?:  (Briiefly per mother attempted)  Feeding Mother's Current Feeding Choice: Breast Milk and Formula Nipple Type: Slow - flow  Lactation Tools Discussed/Used Tools: Pump Breast pump type: Double-Electric Breast Pump;Manual Pump Education: Milk Storage Reason for Pumping: stimulation and supplementation Pumping frequency:  (q 3 hours)  Interventions Interventions: Education;DEBP;Breast feeding basics reviewed  Discharge Pump: Personal;DEBP  Consult Status Consult Status: Follow-up Date: 04/27/21 Follow-up type: In-patient    [name] [facility] 04/26/2021, 10:17 AM

## 2021-04-26 NOTE — Lactation Note (Signed)
This note was copied from a baby's chart. Lactation Consultation Note  Patient Name: Boy Banner Irelan [id number] Date: 04/26/2021   Age:33 hours Per RN Earnest Bailey) in [place] declined [facility] services at this time, she would like to be seen in the morning.  Maternal Data    Feeding Nipple Type: Slow - flow  LATCH Score                    Lactation Tools Discussed/Used    Interventions    Discharge    Consult Status      Vicente Serene 04/26/2021, 2:20 AM

## 2021-04-26 NOTE — Lactation Note (Signed)
This note was copied from a baby's chart. Lactation Consultation Note  Patient Name: [name] [id number] Date: 04/26/2021 Reason for consult: Initial assessment Age:33 hours  P2, Mother and baby resting.  Request LC come back later today.   Maternal Data Does the patient have breastfeeding experience prior to this delivery?: Yes How long did the patient breastfeed?:  (Briiefly per mother attempted)  Feeding Nipple Type: Slow - flow   Interventions Interventions: [facility] Services brochure  Discharge    Consult Status Consult Status: Follow-up Date: 04/26/21 Follow-up type: In-patient    [name] [facility] 04/26/2021, 7:49 AM

## 2021-04-26 NOTE — Progress Notes (Signed)
PPD1 SVD:   S:  Pt reports feeling well. Denies any PIH sx/ Tolerating po/ Voiding without problems/ No n/v/ Bleeding is moderate/ Pain controlled withprescription NSAID's including ibuprofen (Motrin)  Newborn info live female plans circ   O:  A & O x 3 / VS: Blood pressure 114/71, pulse 86, temperature 98 F (36.7 C), temperature source Oral, resp. rate 18, height 5\' 2"  (1.575 m), weight 82.1 kg, SpO2 96 %, unknown if currently breastfeeding.  LABS:  Results for orders placed or performed during the hospital encounter of 04/25/21 (from the past 24 hour(s))  CBC     Status: Abnormal   Collection Time: 04/25/21 11:25 AM  Result Value Ref Range   WBC 7.8 4.0 - 10.5 K/uL   RBC 3.97 3.87 - 5.11 MIL/uL   Hemoglobin 11.3 (L) 12.0 - 15.0 g/dL   HCT 04/27/21 (L) 75.1 - 70.0 %   MCV 87.2 80.0 - 100.0 fL   MCH 28.5 26.0 - 34.0 pg   MCHC 32.7 30.0 - 36.0 g/dL   RDW 17.4 94.4 - 96.7 %   Platelets 242 150 - 400 K/uL   nRBC 0.0 0.0 - 0.2 %  Type and screen [facility]     Status: None   Collection Time: 04/25/21 11:25 AM  Result Value Ref Range   ABO/RH(D) A POS    Antibody Screen NEG    Sample Expiration      04/28/2021,2359 Performed at [facility], [address], [place], [place]   Basic metabolic panel     Status: Abnormal   Collection Time: 04/25/21 11:25 AM  Result Value Ref Range   Sodium 135 135 - 145 mmol/L   Potassium 3.6 3.5 - 5.1 mmol/L   Chloride 106 98 - 111 mmol/L   CO2 20 (L) 22 - 32 mmol/L   Glucose, Bld 104 (H) 70 - 99 mg/dL   BUN 6 6 - 20 mg/dL   Creatinine, Ser 04/27/21 0.44 - 1.00 mg/dL   Calcium 9.3 8.9 - 6.59 mg/dL   GFR, Estimated 93.5 >70 mL/min   Anion gap 9 5 - 15  Resp Panel by RT-PCR (Flu A&B, Covid) Nasopharyngeal Swab     Status: None   Collection Time: 04/25/21 12:02 PM   Specimen: Nasopharyngeal Swab; Nasopharyngeal(NP) swabs in vial transport medium  Result Value Ref Range   SARS Coronavirus 2 by RT PCR NEGATIVE NEGATIVE    Influenza A by PCR NEGATIVE NEGATIVE   Influenza B by PCR NEGATIVE NEGATIVE  Protein / creatinine ratio, urine     Status: None   Collection Time: 04/25/21  2:40 PM  Result Value Ref Range   Creatinine, Urine 14.14 mg/dL   Total Protein, Urine <6 mg/dL   Protein Creatinine Ratio        0.00 - 0.15 mg/mg[Cre]  Magnesium     Status: Abnormal   Collection Time: 04/25/21  4:57 PM  Result Value Ref Range   Magnesium 4.3 (H) 1.7 - 2.4 mg/dL  CBC with Differential/Platelet     Status: Abnormal   Collection Time: 04/25/21  8:21 PM  Result Value Ref Range   WBC 12.1 (H) 4.0 - 10.5 K/uL   RBC 4.01 3.87 - 5.11 MIL/uL   Hemoglobin 11.6 (L) 12.0 - 15.0 g/dL   HCT 04/27/21 (L) 79.3 - 90.3 %   MCV 85.5 80.0 - 100.0 fL   MCH 28.9 26.0 - 34.0 pg   MCHC 33.8 30.0 - 36.0  g/dL   RDW 60.7 37.1 - 06.2 %   Platelets 232 150 - 400 K/uL   nRBC 0.0 0.0 - 0.2 %   Neutrophils Relative % 83 %   Neutro Abs 10.0 (H) 1.7 - 7.7 K/uL   Lymphocytes Relative 11 %   Lymphs Abs 1.4 0.7 - 4.0 K/uL   Monocytes Relative 6 %   Monocytes Absolute 0.7 0.1 - 1.0 K/uL   Eosinophils Relative 0 %   Eosinophils Absolute 0.0 0.0 - 0.5 K/uL   Basophils Relative 0 %   Basophils Absolute 0.0 0.0 - 0.1 K/uL   Immature Granulocytes 0 %   Abs Immature Granulocytes 0.05 0.00 - 0.07 K/uL  CBC     Status: Abnormal   Collection Time: 04/26/21  4:48 AM  Result Value Ref Range   WBC 9.9 4.0 - 10.5 K/uL   RBC 3.60 (L) 3.87 - 5.11 MIL/uL   Hemoglobin 10.5 (L) 12.0 - 15.0 g/dL   HCT 69.4 (L) 85.4 - 62.7 %   MCV 86.1 80.0 - 100.0 fL   MCH 29.2 26.0 - 34.0 pg   MCHC 33.9 30.0 - 36.0 g/dL   RDW 03.5 00.9 - 38.1 %   Platelets 219 150 - 400 K/uL   nRBC 0.0 0.0 - 0.2 %  Comprehensive metabolic panel     Status: Abnormal   Collection Time: 04/26/21  4:48 AM  Result Value Ref Range   Sodium 135 135 - 145 mmol/L   Potassium 3.6 3.5 - 5.1 mmol/L   Chloride 107 98 - 111 mmol/L   CO2 21 (L) 22 - 32 mmol/L   Glucose, Bld 108 (H) 70 - 99  mg/dL   BUN <5 (L) 6 - 20 mg/dL   Creatinine, Ser 8.29 (L) 0.44 - 1.00 mg/dL   Calcium 7.5 (L) 8.9 - 10.3 mg/dL   Total Protein 5.9 (L) 6.5 - 8.1 g/dL   Albumin 2.8 (L) 3.5 - 5.0 g/dL   AST 22 15 - 41 U/L   ALT 14 0 - 44 U/L   Alkaline Phosphatase 103 38 - 126 U/L   Total Bilirubin 0.3 0.3 - 1.2 mg/dL   GFR, Estimated >93 >71 mL/min   Anion gap 7 5 - 15    I&O: I/O last 3 completed shifts: In: 6090.4 [P.O.:2520; I.V.:3570.4] Out: 3800 [Urine:3700; Blood:100]   No intake/output data recorded.  Lungs: chest clear, no wheezing, rales, normal symmetric air entry  Heart: regular rate and rhythm, S1, S2 normal, no murmur, click, rub or gallop  Abdomen:soft uterus firm at umb  Perineum: is normal  Lochia: med  Extremities:no redness or tenderness in the calves or thighs, edema tr+    A/P: PPD # 1/ [id number] chronic HTN on labetalol  on magnesium sulfate  Doing well  Continue routine post partum orders  D/c magnesium after 24 hrs post delivery Anticipate d/c in am. Will have BP mgmt with Dr Duke Salvia( cardiologist)

## 2021-04-26 NOTE — Progress Notes (Signed)
MOB was referred for history of depression. * Referral screened out by Clinical Social Worker because none of the following criteria appear to apply: ~ History of anxiety/depression during this pregnancy, or of post-partum depression following prior delivery. ~ Diagnosis of anxiety and/or depression within last 3 years OR * MOB's symptoms currently being treated with medication and/or therapy. Per chart review, MOB is currently prescribed/taking Zoloft.  Please contact the Clinical Social Worker if needs arise, by MOB request, or if MOB scores greater than 9/yes to question 10 on Edinburgh Postpartum Depression Screen.  Kierah Goatley, LCSW Clinical Social Worker [facility] Cell#: [phone] 

## 2021-04-27 MED ORDER — IBUPROFEN 600 MG PO TABS: 600.0000 mg | ORAL_TABLET | Freq: Four times a day (QID) | ORAL | 11 refills | Status: DC | PRN

## 2021-04-27 MED ORDER — LABETALOL HCL 100 MG PO TABS: 100.0000 mg | ORAL_TABLET | Freq: Two times a day (BID) | ORAL | 11 refills | Status: DC

## 2021-04-27 NOTE — Progress Notes (Signed)
PPD2 SVD:   S:  Pt reports feeling well/ Tolerating po/ Voiding without problems/ No n/v/ Bleeding is light/ Pain controlled withprescription NSAID's including ibuprofen (Motrin)  Newborn info  live female . circ   O:  A & O x 3 / VS: Blood pressure 134/73, pulse 75, temperature 98.1 F (36.7 C), temperature source Oral, resp. rate 17, height 5\' 2"  (1.575 m), weight 82.1 kg, SpO2 100 %, unknown if currently breastfeeding.  LABS: No results found for this or any previous visit (from the past 24 hour(s)).  I&O: I/O last 3 completed shifts: In: 5660.3 [P.O.:2700; I.V.:2960.3] Out: 5350 [Urine:5350]   No intake/output data recorded.  Lungs: chest clear, no wheezing, rales, normal symmetric air entry  Heart: regular rate and rhythm, S1, S2 normal, no murmur, click, rub or gallop  Abdomen: uterus firm at umb non tender  Perineum: is normal  Lochia: light  Extremities:no redness or tenderness in the calves or thighs, no edema    A/P: PPD # 2/ S/P SVD Chronic HTN  on labetalol  Doing well  Continue routine post partum orders D/c home  Cont labetalol F/u with her cardiologist Dr [id number] this coming week D/C instructions reviewed  F/u 6 wk

## 2021-04-27 NOTE — Discharge Summary (Signed)
Postpartum Discharge Summary  Date of Service updated     Patient Name: [name] DOB: 1990-08-19 MRN: [id number]  Date of admission: 04/25/2021 Delivery date:04/25/2021  Delivering provider: Sarin Comunale  Date of discharge: 04/27/2021 Admitting diagnosis: Labor, chronic HTN 3rd trimester, term Intrauterine pregnancy: [redacted]w[redacted]d     Secondary diagnosis:  Principal Problem:   Indication for care in labor or delivery Active Problems:   Postpartum care following vaginal delivery  Additional problems: none    Discharge diagnosis: Term Pregnancy Delivered and CHTN with superimposed preeclampsia                                              Post partum procedures: n/a Augmentation: AROM Complications: None  Hospital course: Onset of Labor With Vaginal Delivery      31 y.o. yo [id number] at [redacted]w[redacted]d was admitted in Active Labor on 04/25/2021. Patient had a labor course as follows: 1) severe range BP noted after admission for which  Magnesium sulfate was started. Pitocin augmentation of labor . Pt was started on labetalol as well. [place] labs were done and was normal Membrane Rupture Time/Date: 5:38 PM ,04/25/2021   amnioinfusion started for variable decelerations Delivery Method:Vaginal, Spontaneous  Episiotomy: None  Lacerations:  None  Patient had an uncomplicated postpartum course.  Pt was given 24 hr Magnesium sulfate She is ambulating, tolerating a regular diet, passing flatus, and urinating well. Patient is discharged home in stable condition on 04/27/2021.  Newborn Data: Birth date:04/25/2021  Birth time:6:23 PM  Gender:Female  Living status:Living  Apgars:8 ,9  Weight:3.19 kg   Magnesium Sulfate received: Yes: Seizure prophylaxis BMZ received: No Rhophylac:N/A MMR:No T-DaP:Given prenatally Flu: No Transfusion:No  Physical exam  Vitals:   04/26/21 1603 04/26/21 2004 04/27/21 0008 04/27/21 0303  BP: (!) 149/83 (!) 143/82 132/63 134/73  Pulse: 89 90 75 75  Resp: $Remo'18 16 16 17   'sbDLp$ Temp: 98 F (36.7 C) 98 F (36.7 C) 98.1 F (36.7 C)   TempSrc: Oral Oral Oral   SpO2: 98% 98% 99% 100%  Weight:      Height:       General: alert, cooperative, and no distress Lochia: appropriate Uterine Fundus: firm Incision: N/A DVT Evaluation: No evidence of DVT seen on physical exam. No significant calf/ankle edema. Labs: Lab Results  Component Value Date   WBC 9.9 04/26/2021   HGB 10.5 (L) 04/26/2021   HCT 31.0 (L) 04/26/2021   MCV 86.1 04/26/2021   PLT 219 04/26/2021   CMP Latest Ref Rng & Units 04/26/2021  Glucose 70 - 99 mg/dL 108(H)  BUN 6 - 20 mg/dL <5(L)  Creatinine 0.44 - 1.00 mg/dL 0.40(L)  Sodium 135 - 145 mmol/L 135  Potassium 3.5 - 5.1 mmol/L 3.6  Chloride 98 - 111 mmol/L 107  CO2 22 - 32 mmol/L 21(L)  Calcium 8.9 - 10.3 mg/dL 7.5(L)  Total Protein 6.5 - 8.1 g/dL 5.9(L)  Total Bilirubin 0.3 - 1.2 mg/dL 0.3  Alkaline Phos 38 - 126 U/L 103  AST 15 - 41 U/L 22  ALT 0 - 44 U/L 14   Edinburgh Score: No flowsheet data found.    After visit meds:  Allergies as of 04/27/2021   No Known Allergies      Medication List     TAKE these medications    ferrous sulfate 325 (65 FE) MG  EC tablet Take 325 mg by mouth 3 (three) times daily with meals.   ibuprofen 600 MG tablet Commonly known as: ADVIL Take 1 tablet (600 mg total) by mouth every 6 (six) hours as needed for moderate pain.   labetalol 100 MG tablet Commonly known as: NORMODYNE Take 1 tablet (100 mg total) by mouth 2 (two) times daily.   prenatal multivitamin Tabs tablet Take 1 tablet by mouth daily.   sertraline 25 MG tablet Commonly known as: ZOLOFT Take 25 mg by mouth daily.   vitamin C 500 MG tablet Commonly known as: ASCORBIC ACID Take 500 mg by mouth daily.         Discharge home in stable condition Infant Feeding: Bottle and Breast Infant Disposition:home with mother Discharge instruction: per After Visit Summary and Postpartum booklet. Activity: Advance as  tolerated. Pelvic rest for 6 weeks.  Diet: low salt diet Anticipated Birth Control: Unsure Postpartum Appointment:1 week Additional Postpartum F/U: BP check 1 week Future Appointments:No future appointments. Follow up Visit:  Follow-up Information     Skeet Latch, MD Follow up in 1 week(s).   Specialty: Cardiology Contact information: [place] [address] [phone]         Servando Salina, MD Follow up in 6 week(s).   Specialty: Obstetrics and Gynecology Contact information: [address] [place] [address] [phone]                     04/27/2021 Marvene Staff, MD

## 2021-04-27 NOTE — Lactation Note (Signed)
This note was copied from a baby's chart. Lactation Consultation Note  Patient Name: [name] [id number] Date: 04/27/2021 Reason for consult: Follow-up assessment;Early term 37-38.6wks;Infant weight loss;Other (Comment) (7 % weight loss / baby post circ, and last fed at 9 am. per  mom my plan is to breast and bottle feed. per mom milk came in, pumped without results. LC offered to assess for engorgement/ mom receptive. Breast have areas of over fullness.) Age:25 hours [place] RN aware mom is icing and then will pump with the DEBP .  Maternal Data    Feeding Mother's Current Feeding Choice: Breast Milk and Formula Nipple Type: Slow - flow  LATCH Score                    Lactation Tools Discussed/Used Tools: Pump Breast pump type: Double-Electric Breast Pump Reason for Pumping: per mom pumped this amd no results - 2 Ice packs fixed for mom and instructed to ice for 15 - 20 mins / and then pump both breast  Interventions Interventions: Breast feeding basics reviewed;Education  Discharge Discharge Education: Engorgement and breast care;Warning signs for feeding baby Pump: Personal;DEBP  Consult Status Consult Status: Complete Date: 04/27/21    Myer Haff 04/27/2021, 10:13 AM

## 2021-04-27 NOTE — Discharge Instructions (Addendum)
Call if temperature greater than equal to 100.4, severe nausea vomiting, increased incisional pain , drainage or redness in the incision site;  nothing per vagina for 4-6 weeks; no straining with bowel movements;  showers no bath

## 2021-04-27 NOTE — Progress Notes (Signed)
Patient's last two blood pressures this morning were:  8:13 am - 153/85 10:25 am - 141/75  Scheduled labetalol 100 mg given at 9:04 am.  Phoned Dr. Cherly Hensen with above information.  She said to proceed with discharge as ordered.  Patient has CHTN and has plan to follow up with her cardiologist.

## 2021-05-03 ENCOUNTER — Inpatient Hospital Stay (HOSPITAL_COMMUNITY): Admit: 2021-05-03 | Payer: Self-pay

## 2021-05-05 ENCOUNTER — Other Ambulatory Visit: Payer: Self-pay

## 2021-05-05 ENCOUNTER — Ambulatory Visit (HOSPITAL_BASED_OUTPATIENT_CLINIC_OR_DEPARTMENT_OTHER): Payer: Managed Care, Other (non HMO) | Admitting: Family

## 2021-05-05 ENCOUNTER — Encounter (HOSPITAL_BASED_OUTPATIENT_CLINIC_OR_DEPARTMENT_OTHER): Payer: Self-pay | Admitting: Family

## 2021-05-05 VITALS — BP 150/100 | HR 63 | Ht 62.0 in | Wt 165.4 lb

## 2021-05-05 DIAGNOSIS — I10 Essential (primary) hypertension: Secondary | ICD-10-CM

## 2021-05-05 MED ORDER — LABETALOL HCL 200 MG PO TABS: 200.0000 mg | ORAL_TABLET | Freq: Two times a day (BID) | ORAL | 1 refills | Status: DC

## 2021-05-05 NOTE — Patient Instructions (Signed)
Medication Instructions:  Your physician has recommended you make the following change in your medication:   Change: : Labetalol 200mg  twice daily   *If you need a refill on your cardiac medications before your next appointment, please call your pharmacy*   Lab Work: None ordered today   Testing/Procedures: None ordered today    Follow-Up: At [facility], you and your health needs are our priority.  As part of our continuing mission to provide you with exceptional heart care, we have created designated Provider Care Teams.  These Care Teams include your primary Cardiologist (physician) and Advanced Practice Providers (APPs -  Physician Assistants and Nurse Practitioners) who all work together to provide you with the care you need, when you need it.  We recommend signing up for the patient portal called "MyChart".  Sign up information is provided on this After Visit Summary.  MyChart is used to connect with patients for Virtual Visits (Telemedicine).  Patients are able to view lab/test results, encounter notes, upcoming appointments, etc.  Non-urgent messages can be sent to your provider as well.   To learn more about what you can do with MyChart, go to [email or web address].    Your next appointment:   3 month(s)  The format for your next appointment:   In Person  Provider:   Skeet Latch, MD or Laurann Montana, NP{   Other Instructions Send [place] a mychart message in one week!
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
## 2021-05-11 ENCOUNTER — Encounter (HOSPITAL_BASED_OUTPATIENT_CLINIC_OR_DEPARTMENT_OTHER): Payer: Self-pay

## 2021-05-12 MED ORDER — NIFEDIPINE ER OSMOTIC RELEASE 30 MG PO TB24: 30.0000 mg | ORAL_TABLET | Freq: Every day | ORAL | 2 refills | Status: DC

## 2021-07-11 ENCOUNTER — Telehealth: Payer: Self-pay | Admitting: Cardiovascular Disease

## 2021-07-11 NOTE — Telephone Encounter (Signed)
Pt sent this Via Mychart to our scheduling pool:  ? ? ?Hi Shana, ?1. No CP night now ?2. With  the CP last night nausea and SOB ?3. Last night was the first episode and lasted about 20-41min ?4. Continuous ?5.No nitro, I took (3) 81mg  ASA since that was all I had on had. CP subsided about 15 min after taking the ASA ? ? ? ?Good Morning [name],  ?Can you tell me a little more about the Chest pains you are having?   ? ?1. Are you having CP right now?  ? ?2. Are you experiencing any other symptoms (ex. SOB, nausea, vomiting, sweating)?  ?3. How long have you been experiencing CP?  ? ?4. Is your CP continuous or coming and going?  ? ?5. Have you taken Nitroglycerin?  ??  ? ? ?Appointment Request From: [name] ? ?With Provider: Skeet Latch, MD [[facility]] ? ?Preferred Date Range: 07/11/2021 - 07/13/2021 ? ?Preferred Times: Any Time ? ?Reason for visit: Office Visit ? ?Comments: ?Episode of chest pain, BP 172/118, can we do an ECG? ?

## 2021-07-11 NOTE — Telephone Encounter (Signed)
-  Pt called to report an episode of CP that developed around 1 am when she got up to fix her newborn a bottle. ?-She described symptoms as someone took the middle of her chest and crumbled it into a ball. ?-She also reported SOB, nausea, and left side arm pain during episode. ?-Pt report symptoms lasted roughly 20-30 minutes and took 3 81 mg ASA ?-Pt denies symptoms currently. ? ?Appointment scheduled on 4/13 for further evaluations. Pt also made aware of ED precaution should any new symptoms develop or worsen.   ? ? ?

## 2021-07-14 ENCOUNTER — Encounter (HOSPITAL_BASED_OUTPATIENT_CLINIC_OR_DEPARTMENT_OTHER): Payer: Self-pay | Admitting: Family

## 2021-07-14 ENCOUNTER — Ambulatory Visit (INDEPENDENT_AMBULATORY_CARE_PROVIDER_SITE_OTHER): Payer: Medicaid Other | Admitting: Family

## 2021-07-14 VITALS — BP 124/72 | HR 67 | Ht 62.0 in | Wt 160.4 lb

## 2021-07-14 DIAGNOSIS — R079 Chest pain, unspecified: Secondary | ICD-10-CM

## 2021-07-14 DIAGNOSIS — I10 Essential (primary) hypertension: Secondary | ICD-10-CM | POA: Diagnosis not present

## 2021-07-14 DIAGNOSIS — R9431 Abnormal electrocardiogram [ECG] [EKG]: Secondary | ICD-10-CM | POA: Diagnosis not present

## 2021-07-14 MED ORDER — METOPROLOL TARTRATE 50 MG PO TABS: ORAL_TABLET | ORAL | 0 refills | Status: DC
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
## 2021-07-14 NOTE — Patient Instructions (Addendum)
Medication Instructions:  ?Continue your current medications.  ? ?*If you need a refill on your cardiac medications before your next appointment, please call your pharmacy* ? ? ?Lab Work: ?Your physician recommends that you return for lab work today: BMP, magnesium ? ?If you have labs (blood work) drawn today and your tests are completely normal, you will receive your results only by: ?MyChart Message (if you have MyChart) OR ?A paper copy in the mail ?If you have any lab test that is abnormal or we need to change your treatment, we will call you to review the results. ? ?Testing/Procedures: ?Your physician has requested that you have cardiac CT. Cardiac computed tomography (CT) is a painless test that uses an x-ray machine to take clear, detailed pictures of your heart. Please follow instruction sheet as given. ? ?Follow-Up: ?At [facility], you and your health needs are our priority.  As part of our continuing mission to provide you with exceptional heart care, we have created designated Provider Care Teams.  These Care Teams include your primary Cardiologist (physician) and Advanced Practice Providers (APPs -  Physician Assistants and Nurse Practitioners) who all work together to provide you with the care you need, when you need it. ? ?We recommend signing up for the patient portal called "MyChart".  Sign up information is provided on this After Visit Summary.  MyChart is used to connect with patients for Virtual Visits (Telemedicine).  Patients are able to view lab/test results, encounter notes, upcoming appointments, etc.  Non-urgent messages can be sent to your provider as well.   ?To learn more about what you can do with MyChart, go to [email or web address].   ? ?Your next appointment:   ?As scheduled with Dr. Duke Salvia ? ? ?Other Instructions ? ? ?Your cardiac CT will be scheduled at one of the below locations:  ? ?[facility] ?[address] ?[place], [place] [address] ?[phone] ? ?If  scheduled at [facility], please arrive at the [facility] and [facility]'s Entrance (Entrance C2) of [facility] 30 minutes prior to test start time. ?You can use the FREE valet parking offered at entrance C (encouraged to control the heart rate for the test)  ?Proceed to the [facility] Radiology Department (first floor) to check-in and test prep. ? ?All radiology patients and guests should use entrance C2 at [facility], accessed from [facility], even though the hospital's physical address listed is [address] ? ? ? ? ?Please follow these instructions carefully (unless otherwise directed): ? ? ?On the Night Before the Test: ?Be sure to Drink plenty of water. ?Do not consume any caffeinated/decaffeinated beverages or chocolate 12 hours prior to your test. ?Do not take any antihistamines 12 hours prior to your test. ? ?On the Day of the Test: ?Drink plenty of water until 1 hour prior to the test. ?Do not eat any food 4 hours prior to the test. ?You may take your regular medications prior to the test.  ?Take metoprolol (Lopressor) two hours prior to test. ?FEMALES- please wear underwire-free bra if available, avoid dresses & tight clothing ?     ?After the Test: ?Drink plenty of water. ?After receiving IV contrast, you may experience a mild flushed feeling. This is normal. ?On occasion, you may experience a mild rash up to 24 hours after the test. This is not dangerous. If this occurs, you can take Benadryl 25 mg and increase your fluid intake. ?If you experience trouble breathing, this  can be serious. If it is severe call 911 IMMEDIATELY. If it is mild, please call our office. ?If you take any of these medications: Glipizide/Metformin, Avandament, Glucavance, please do not take 48 hours after completing test unless otherwise instructed. ? ?We will call to schedule your test 2-4 weeks out understanding that some insurance companies will need an authorization prior to the  service being performed.  ? ?For non-scheduling related questions, please contact the cardiac imaging nurse navigator should you have any questions/concerns: ?Rockwell Alexandria, Cardiac Imaging Nurse Navigator ?Larey Brick, Cardiac Imaging Nurse Navigator ?[facility] ?Direct Office Dial: [phone]  ? ?For scheduling needs, including cancellations and rescheduling, please call [place], [phone].  ? ?Important Information About Sugar ? ? ? ? ?  ?

## 2021-07-15 ENCOUNTER — Telehealth (HOSPITAL_BASED_OUTPATIENT_CLINIC_OR_DEPARTMENT_OTHER): Payer: Self-pay

## 2021-07-15 LAB — BASIC METABOLIC PANEL
BUN/Creatinine Ratio: 30 — ABNORMAL HIGH (ref 9–23)
BUN: 15 mg/dL (ref 6–20)
CO2: 23 mmol/L (ref 20–29)
Calcium: 10 mg/dL (ref 8.7–10.2)
Chloride: 103 mmol/L (ref 96–106)
Creatinine, Ser: 0.5 mg/dL — ABNORMAL LOW (ref 0.57–1.00)
Glucose: 103 mg/dL — ABNORMAL HIGH (ref 70–99)
Potassium: 3.8 mmol/L (ref 3.5–5.2)
Sodium: 138 mmol/L (ref 134–144)
eGFR: 128 mL/min/{1.73_m2} (ref >59)

## 2021-07-15 LAB — MAGNESIUM: Magnesium: 2.2 mg/dL (ref 1.6–2.3)

## 2021-07-15 NOTE — Telephone Encounter (Addendum)
Results called to patient who verbalizes understanding!  ? ? ? ?----- Message from Alver Sorrow, NP sent at 07/15/2021  9:12 AM EDT ----- ?Stable kidney function. Normal electrolytes. Good result!  ?

## 2021-07-26 ENCOUNTER — Encounter (HOSPITAL_BASED_OUTPATIENT_CLINIC_OR_DEPARTMENT_OTHER): Payer: Self-pay | Admitting: Family Medicine

## 2021-07-26 ENCOUNTER — Telehealth (HOSPITAL_COMMUNITY): Payer: Self-pay | Admitting: *Deleted

## 2021-07-26 ENCOUNTER — Other Ambulatory Visit (HOSPITAL_BASED_OUTPATIENT_CLINIC_OR_DEPARTMENT_OTHER): Payer: Self-pay

## 2021-07-26 ENCOUNTER — Ambulatory Visit (INDEPENDENT_AMBULATORY_CARE_PROVIDER_SITE_OTHER): Payer: Medicaid Other | Admitting: Family Medicine

## 2021-07-26 VITALS — BP 121/71 | HR 71 | Temp 97.7°F | Ht 62.0 in | Wt 160.0 lb

## 2021-07-26 DIAGNOSIS — I10 Essential (primary) hypertension: Secondary | ICD-10-CM

## 2021-07-26 DIAGNOSIS — E049 Nontoxic goiter, unspecified: Secondary | ICD-10-CM | POA: Insufficient documentation

## 2021-07-26 DIAGNOSIS — Z0000 Encounter for general adult medical examination without abnormal findings: Secondary | ICD-10-CM

## 2021-07-26 NOTE — Patient Instructions (Signed)
Goiter  A goiter is an enlarged thyroid gland. The thyroid is located in the lower front of the neck. It makes hormones that affect many body parts and systems, including the system that affects how quickly the body burns fuel for energy (metabolism). Most goiters are painless and are not a cause for concern. Some goiters can affect the way your thyroid makes thyroid hormones. Goiters and conditions that cause goiters can be treated, if necessary. What are the causes? Common causes of this condition include: Lack (deficiency) of a mineral called iodine. The thyroid gland uses iodine to make thyroid hormones. Diseases that attack healthy cells in the body (autoimmune diseases) and affect thyroid function, such as Graves' disease or Hashimoto's disease. These diseases may cause the body to produce too much thyroid hormone (hyperthyroidism) or too little of the hormone (hypothyroidism). Conditions that cause inflammation of the thyroid (thyroiditis). One or more small growths on the thyroid (nodular goiter). Other causes include: Medical problems caused by abnormal genes that are passed from parent to child (genetic defects). Thyroid injury or infection. Tumors that may or may not be cancerous. Pregnancy. Certain medicines. Exposure to radiation. In some cases, the cause may not be known. What increases the risk? This condition is more likely to develop in: People who do not get enough iodine in their diet. People who have a family history of goiter. Women. People who are older than age 40. People who smoke tobacco. People who have had exposure to radiation. What are the signs or symptoms? The main symptom of this condition is swelling in the lower, front part of the neck. This swelling can range from a very small bump to a large lump. Other symptoms may include: A tight feeling in the throat. A hoarse voice. Coughing. Wheezing. Difficulty swallowing or breathing. Bulging veins in the  neck. Dizziness. When a goiter is the result of an overactive thyroid (hyperthyroidism), symptoms may also include: Nervousness or restlessness. Inability to tolerate heat. Unexplained weight loss. Diarrhea. Change in the texture of hair or skin. Changes in heartbeat, such as skipped beats, extra beats, or a rapid heart rate. Loss of menstruation. Shaky hands. Increased appetite. Sleep problems. When a goiter is the result of an underactive thyroid (hypothyroidism), symptoms may also include: Feeling like you have no energy (lethargy). Inability to tolerate cold. Weight gain that is not explained by a change in diet or exercise habits. Dry skin. Coarse hair. Irregular menstrual periods. Constipation. Sadness or depression. Fatigue. In some cases, there may not be any symptoms and the thyroid hormone levels may be normal. How is this diagnosed? This condition may be diagnosed based on your symptoms, your medical history, and a physical exam. You may have tests, such as: Blood tests to check thyroid function. Imaging tests, such as: Ultrasound. CT scan. MRI. Thyroid scan. Removal of a tissue sample (biopsy) of the goiter or any nodules. The sample will be tested to check for cancer. How is this treated? Treatment for this condition depends on the cause and your symptoms. Treatment may include: Medicines to regulate thyroid hormone levels. Anti-inflammatory medicines or steroid medicines, if the goiter is caused by inflammation. Iodine supplements or changes to your diet, if the goiter is caused by iodine deficiency. Radioactive iodine treatment. Surgery to remove your thyroid. In some cases, you may only need regular check-ups with your health care provider to monitor your condition, and you may not need treatment. Follow these instructions at home: Follow instructions from your health care   provider about any changes to your diet. Take over-the-counter and prescription  medicines only as told by your health care provider. These include supplements. Do not use any products that contain nicotine or tobacco, such as cigarettes and e-cigarettes. If you need help quitting, ask your health care provider. Keep all follow-up visits as told by your health care provider. This is important. Contact a health care provider if: Your symptoms do not get better with treatment. You have nausea, vomiting, or diarrhea. Get help right away if: You have sudden, unexplained confusion or other mental changes. You have a fever. You have chest pain. You have trouble breathing or swallowing. You suddenly become very weak. You experience extreme restlessness. You feel your heart racing. Summary A goiter is an enlarged thyroid gland. The thyroid gland is located in the lower front of the neck. It makes hormones that affect many body parts and systems, including the system that affects how quickly the body burns fuel for energy (metabolism). The main symptom of this condition is swelling in the lower, front part of the neck. This swelling can range from a very small bump to a large lump. Treatment for this condition depends on the cause and your symptoms. You may need medicines, supplements, or regular monitoring of your condition. This information is not intended to replace advice given to you by your health care provider. Make sure you discuss any questions you have with your health care provider. Document Revised: 04/21/2021 Document Reviewed: 11/27/2019 Elsevier Patient Education  2023 Elsevier Inc.  

## 2021-07-26 NOTE — Assessment & Plan Note (Signed)
Blood pressure at goal in office today ?Can continue with labetalol, nifedipine ?Recommend intermittent monitoring, DASH diet, regular aerobic exercise ?

## 2021-07-26 NOTE — Telephone Encounter (Signed)
Attempted to call patient regarding upcoming cardiac CT appointment. °Left message on voicemail with name and callback number ° °Jen Benedict RN Navigator Cardiac Imaging °[facility] °[phone] Office °[phone] Cell ° °

## 2021-07-26 NOTE — Assessment & Plan Note (Signed)
Has had some new symptoms of right-sided neck discomfort, prior evaluation has been unremarkable ?Today, we can recheck thyroid function studies, consider additional evaluation with thyroid ultrasound, referral to endocrinology ?
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
## 2021-07-27 ENCOUNTER — Ambulatory Visit (HOSPITAL_COMMUNITY)
Admission: RE | Admit: 2021-07-27 | Discharge: 2021-07-27 | Disposition: A | Discharge status: Home / Self Care | Payer: Medicaid Other | Source: Ambulatory Visit | Attending: Family | Admitting: Family

## 2021-07-27 DIAGNOSIS — R079 Chest pain, unspecified: Secondary | ICD-10-CM

## 2021-07-27 LAB — THYROID PANEL WITH TSH
Free Thyroxine Index: 1.8 (ref 1.2–4.9)
T3 Uptake Ratio: 27 % (ref 24–39)
T4, Total: 6.5 ug/dL (ref 4.5–12.0)
TSH: 0.896 u[IU]/mL (ref 0.450–4.500)

## 2021-07-27 MED ORDER — NITROGLYCERIN 0.4 MG SL SUBL
0.8000 mg | SUBLINGUAL_TABLET | Freq: Once | SUBLINGUAL | Status: AC
  Administered 2021-07-27: 0.8 mg via SUBLINGUAL

## 2021-07-27 MED ORDER — IOHEXOL 350 MG/ML SOLN
100.0000 mL | Freq: Once | INTRAVENOUS | Status: AC | PRN
  Administered 2021-07-27: 100 mL via INTRAVENOUS

## 2021-07-27 MED ORDER — NITROGLYCERIN 0.4 MG SL SUBL
SUBLINGUAL_TABLET | SUBLINGUAL | Status: AC
  Filled 2021-07-27: qty 2

## 2021-08-10 ENCOUNTER — Encounter (HOSPITAL_BASED_OUTPATIENT_CLINIC_OR_DEPARTMENT_OTHER): Payer: Self-pay | Admitting: Cardiovascular Disease

## 2021-08-10 ENCOUNTER — Ambulatory Visit (INDEPENDENT_AMBULATORY_CARE_PROVIDER_SITE_OTHER): Payer: Medicaid Other | Admitting: Cardiovascular Disease

## 2021-08-10 VITALS — BP 110/64 | HR 77 | Ht 62.0 in | Wt 161.2 lb

## 2021-08-10 DIAGNOSIS — I10 Essential (primary) hypertension: Secondary | ICD-10-CM

## 2021-08-10 DIAGNOSIS — R079 Chest pain, unspecified: Secondary | ICD-10-CM | POA: Diagnosis not present

## 2021-08-10 HISTORY — DX: Chest pain, unspecified: R07.9

## 2021-08-10 NOTE — Patient Instructions (Signed)
Medication Instructions:  ?Your physician recommends that you continue on your current medications as directed. Please refer to the Current Medication list given to you today. ? ?*If you need a refill on your cardiac medications before your next appointment, please call your pharmacy* ? ? ?Lab Work: ?Renin-Aldosterone lab test today ? ?If you have labs (blood work) drawn today and your tests are completely normal, you will receive your results only by: ?MyChart Message (if you have MyChart) OR ?A paper copy in the mail ?If you have any lab test that is abnormal or we need to change your treatment, we will call you to review the results. ? ? ?Testing/Procedures: ?Renal Artery Doppler at [facility] ? ? ?Follow-Up: ?At [facility], you and your health needs are our priority.  As part of our continuing mission to provide you with exceptional heart care, we have created designated Provider Care Teams.  These Care Teams include your primary Cardiologist (physician) and Advanced Practice Providers (APPs -  Physician Assistants and Nurse Practitioners) who all work together to provide you with the care you need, when you need it. ? ?We recommend signing up for the patient portal called "MyChart".  Sign up information is provided on this After Visit Summary.  MyChart is used to connect with patients for Virtual Visits (Telemedicine).  Patients are able to view lab/test results, encounter notes, upcoming appointments, etc.  Non-urgent messages can be sent to your provider as well.   ?To learn more about what you can do with MyChart, go to [email or web address].   ? ?Your next appointment:   ? ?AS NEEDED with Dr. Duke Salvia  ?

## 2021-08-10 NOTE — Assessment & Plan Note (Signed)
Resolved.  Coronary CTA showed no evidence of CAD. ?

## 2021-08-10 NOTE — Assessment & Plan Note (Signed)
Blood pressure is well controlled.  She is working on increasing her exercise.  She is doing a good job of limiting her sodium intake.  Continue nifedipine and labetalol.  Given that she developed hypertension at age 31, we will check renal artery Dopplers and renal and and aldosterone levels to rule out renal artery stenosis and hyperaldosteronism.  At this point she can continue follow-up with her PCP.  Recommend maintaining a blood pressure less than 130/80.  We are happy to see her in the future should she develop any recurrent issues. ?
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
## 2021-08-17 ENCOUNTER — Encounter (HOSPITAL_BASED_OUTPATIENT_CLINIC_OR_DEPARTMENT_OTHER): Payer: Commercial Managed Care - HMO

## 2021-08-17 LAB — ALDOSTERONE + RENIN ACTIVITY W/ RATIO
ALDOS/RENIN RATIO: >25.1 (ref 0.0–30.0)
ALDOSTERONE: 4.2 ng/dL (ref 0.0–30.0)
Renin: <0.167 ng/mL/hr — ABNORMAL LOW (ref 0.167–5.380)

## 2021-09-01 ENCOUNTER — Ambulatory Visit (INDEPENDENT_AMBULATORY_CARE_PROVIDER_SITE_OTHER): Payer: Medicaid Other

## 2021-09-01 ENCOUNTER — Encounter (HOSPITAL_BASED_OUTPATIENT_CLINIC_OR_DEPARTMENT_OTHER): Payer: Self-pay

## 2021-09-01 DIAGNOSIS — I10 Essential (primary) hypertension: Secondary | ICD-10-CM | POA: Diagnosis not present

## 2021-09-01 MED ORDER — NIFEDIPINE ER OSMOTIC RELEASE 30 MG PO TB24: 30.0000 mg | ORAL_TABLET | Freq: Every day | ORAL | 3 refills | Status: AC

## 2021-10-17 ENCOUNTER — Ambulatory Visit (HOSPITAL_BASED_OUTPATIENT_CLINIC_OR_DEPARTMENT_OTHER): Payer: Medicaid Other

## 2021-10-25 ENCOUNTER — Encounter (HOSPITAL_BASED_OUTPATIENT_CLINIC_OR_DEPARTMENT_OTHER): Payer: Medicaid Other | Admitting: Family Medicine

## 2021-10-31 ENCOUNTER — Encounter (HOSPITAL_BASED_OUTPATIENT_CLINIC_OR_DEPARTMENT_OTHER): Payer: Self-pay | Admitting: Family Medicine

## 2021-11-22 ENCOUNTER — Other Ambulatory Visit (HOSPITAL_BASED_OUTPATIENT_CLINIC_OR_DEPARTMENT_OTHER): Payer: Self-pay | Admitting: Family

## 2021-11-22 DIAGNOSIS — I10 Essential (primary) hypertension: Secondary | ICD-10-CM

## 2021-11-22 NOTE — Telephone Encounter (Signed)
Rx(s) sent to pharmacy electronically.  

## 2022-06-22 DIAGNOSIS — Z13220 Encounter for screening for lipoid disorders: Secondary | ICD-10-CM | POA: Diagnosis not present

## 2022-06-22 DIAGNOSIS — F3341 Major depressive disorder, recurrent, in partial remission: Secondary | ICD-10-CM | POA: Diagnosis not present

## 2022-06-22 DIAGNOSIS — Z0000 Encounter for general adult medical examination without abnormal findings: Secondary | ICD-10-CM | POA: Diagnosis not present

## 2022-06-22 DIAGNOSIS — E559 Vitamin D deficiency, unspecified: Secondary | ICD-10-CM | POA: Diagnosis not present

## 2022-06-22 DIAGNOSIS — I10 Essential (primary) hypertension: Secondary | ICD-10-CM | POA: Diagnosis not present

## 2022-09-11 ENCOUNTER — Emergency Department (HOSPITAL_BASED_OUTPATIENT_CLINIC_OR_DEPARTMENT_OTHER): Payer: BC Managed Care – PPO

## 2022-09-11 ENCOUNTER — Encounter (HOSPITAL_BASED_OUTPATIENT_CLINIC_OR_DEPARTMENT_OTHER): Payer: Self-pay | Admitting: Emergency Medicine

## 2022-09-11 ENCOUNTER — Emergency Department (HOSPITAL_BASED_OUTPATIENT_CLINIC_OR_DEPARTMENT_OTHER)
Admission: EM | Admit: 2022-09-11 | Discharge: 2022-09-11 | Disposition: A | Discharge status: Home / Self Care | Payer: BC Managed Care – PPO | Attending: Emergency Medicine | Admitting: Emergency Medicine

## 2022-09-11 DIAGNOSIS — Z79899 Other long term (current) drug therapy: Secondary | ICD-10-CM | POA: Insufficient documentation

## 2022-09-11 DIAGNOSIS — I10 Essential (primary) hypertension: Secondary | ICD-10-CM | POA: Insufficient documentation

## 2022-09-11 DIAGNOSIS — R1032 Left lower quadrant pain: Secondary | ICD-10-CM | POA: Diagnosis not present

## 2022-09-11 DIAGNOSIS — K529 Noninfective gastroenteritis and colitis, unspecified: Secondary | ICD-10-CM | POA: Diagnosis not present

## 2022-09-11 DIAGNOSIS — K3189 Other diseases of stomach and duodenum: Secondary | ICD-10-CM | POA: Diagnosis not present

## 2022-09-11 LAB — COMPREHENSIVE METABOLIC PANEL
ALT: 7 U/L (ref 0–44)
AST: 13 U/L — ABNORMAL LOW (ref 15–41)
Albumin: 4.6 g/dL (ref 3.5–5.0)
Alkaline Phosphatase: 58 U/L (ref 38–126)
Anion gap: 11 (ref 5–15)
BUN: 15 mg/dL (ref 6–20)
CO2: 22 mmol/L (ref 22–32)
Calcium: 9.4 mg/dL (ref 8.9–10.3)
Chloride: 104 mmol/L (ref 98–111)
Creatinine, Ser: 0.46 mg/dL (ref 0.44–1.00)
GFR, Estimated: >60 mL/min (ref >60)
Glucose, Bld: 122 mg/dL — ABNORMAL HIGH (ref 70–99)
Potassium: 3.6 mmol/L (ref 3.5–5.1)
Sodium: 137 mmol/L (ref 135–145)
Total Bilirubin: 0.7 mg/dL (ref 0.3–1.2)
Total Protein: 7.6 g/dL (ref 6.5–8.1)

## 2022-09-11 LAB — URINALYSIS, ROUTINE W REFLEX MICROSCOPIC
Bacteria, UA: NONE SEEN
Bilirubin Urine: NEGATIVE
Glucose, UA: NEGATIVE mg/dL
Ketones, ur: NEGATIVE mg/dL
Nitrite: NEGATIVE
Specific Gravity, Urine: 1.03 (ref 1.005–1.030)
pH: 6 (ref 5.0–8.0)

## 2022-09-11 LAB — LIPASE, BLOOD: Lipase: 23 U/L (ref 11–51)

## 2022-09-11 LAB — CBC
HCT: 38.2 % (ref 36.0–46.0)
Hemoglobin: 12.6 g/dL (ref 12.0–15.0)
MCH: 26.8 pg (ref 26.0–34.0)
MCHC: 33 g/dL (ref 30.0–36.0)
MCV: 81.1 fL (ref 80.0–100.0)
Platelets: 360 10*3/uL (ref 150–400)
RBC: 4.71 MIL/uL (ref 3.87–5.11)
RDW: 14.1 % (ref 11.5–15.5)
WBC: 10.4 10*3/uL (ref 4.0–10.5)
nRBC: 0 % (ref 0.0–0.2)

## 2022-09-11 LAB — PREGNANCY, URINE: Preg Test, Ur: NEGATIVE

## 2022-09-11 MED ORDER — IOHEXOL 300 MG/ML  SOLN
100.0000 mL | Freq: Once | INTRAMUSCULAR | Status: AC | PRN
  Administered 2022-09-11: 100 mL via INTRAVENOUS

## 2022-09-11 MED ORDER — KETOROLAC TROMETHAMINE 30 MG/ML IJ SOLN
30.0000 mg | Freq: Once | INTRAMUSCULAR | Status: AC
  Administered 2022-09-11: 30 mg via INTRAVENOUS
  Filled 2022-09-11: qty 1

## 2022-09-11 MED ORDER — ONDANSETRON HCL 4 MG/2ML IJ SOLN
4.0000 mg | Freq: Once | INTRAMUSCULAR | Status: AC
  Administered 2022-09-11: 4 mg via INTRAVENOUS
  Filled 2022-09-11: qty 2

## 2022-09-11 MED ORDER — SODIUM CHLORIDE 0.9 % IV BOLUS
1000.0000 mL | Freq: Once | INTRAVENOUS | Status: AC
  Administered 2022-09-11: 1000 mL via INTRAVENOUS

## 2022-09-11 MED ORDER — AMOXICILLIN-POT CLAVULANATE 875-125 MG PO TABS
1.0000 | ORAL_TABLET | Freq: Once | ORAL | Status: AC
  Administered 2022-09-11: 1 via ORAL
  Filled 2022-09-11: qty 1

## 2022-09-11 MED ORDER — AMOXICILLIN-POT CLAVULANATE 500-125 MG PO TABS: 1.0000 | ORAL_TABLET | Freq: Three times a day (TID) | ORAL | 0 refills | Status: AC

## 2022-09-11 NOTE — ED Provider Notes (Signed)
[place] EMERGENCY DEPARTMENT AT [facility] Provider Note   CSN: [id number] Arrival date & time: 09/11/22  0108     History  Chief Complaint  Patient presents with   Abdominal Pain    [name] is a 32 y.o. female.  Patient is a 32 year old female with history of hypertension.  Patient presenting today with complaints of abdominal pain.  She describes a 2-day history of left lower quadrant pain that is constant, but seems to worsen when she eats.  She denies any nausea or vomiting.  She denies any diarrhea or constipation.  No urinary complaints.  No alleviating factors.  The history is provided by the patient.       Home Medications Prior to Admission medications   Medication Sig Start Date End Date Taking? Authorizing Provider  labetalol (NORMODYNE) 200 MG tablet Take 1 tablet by mouth twice daily 11/22/21   Chilton Si, MD  NIFEdipine (PROCARDIA-XL/NIFEDICAL-XL) 30 MG 24 hr tablet Take 1 tablet (30 mg total) by mouth daily. 09/01/21   Alver Sorrow, NP  sertraline (ZOLOFT) 25 MG tablet Take 25 mg by mouth daily.    [provider]      Allergies    Patient has no known allergies.    Review of Systems   Review of Systems  All other systems reviewed and are negative.   Physical Exam Updated Vital Signs BP (!) 155/101   Pulse 76   Temp 98.6 F (37 C) (Oral)   Resp 18   LMP 09/06/2022   SpO2 96%   Breastfeeding Yes  Physical Exam Vitals and nursing note reviewed.  Constitutional:      General: She is not in acute distress.    Appearance: She is well-developed. She is not diaphoretic.  HENT:     Head: Normocephalic and atraumatic.  Cardiovascular:     Rate and Rhythm: Normal rate and regular rhythm.     Heart sounds: No murmur heard.    No friction rub. No gallop.  Pulmonary:     Effort: Pulmonary effort is normal. No respiratory distress.     Breath sounds: Normal breath sounds. No wheezing.  Abdominal:     General: Bowel  sounds are normal. There is no distension.     Palpations: Abdomen is soft.     Tenderness: There is abdominal tenderness in the left lower quadrant. There is no right CVA tenderness, left CVA tenderness, guarding or rebound.  Musculoskeletal:        General: Normal range of motion.     Cervical back: Normal range of motion and neck supple.  Skin:    General: Skin is warm and dry.  Neurological:     General: No focal deficit present.     Mental Status: She is alert and oriented to person, place, and time.     ED Results / Procedures / Treatments   Labs (all labs ordered are listed, but only abnormal results are displayed) Labs Reviewed  CBC  LIPASE, BLOOD  COMPREHENSIVE METABOLIC PANEL  URINALYSIS, ROUTINE W REFLEX MICROSCOPIC  PREGNANCY, URINE    EKG None  Radiology No results found.  Procedures Procedures    Medications Ordered in ED Medications  sodium chloride 0.9 % bolus 1,000 mL (has no administration in time range)  ondansetron (ZOFRAN) injection 4 mg (has no administration in time range)  ketorolac (TORADOL) 30 MG/ML injection 30 mg (has no administration in time range)    ED Course/ Medical Decision Making/ A&P  Patient is a 32 year old female presenting with abdominal pain as described in the HPI.  She has tenderness in the left lower quadrant and suprapubic region, but no peritoneal signs.  Patient arrives here with stable vital signs and is afebrile.  Workup initiated including CBC, metabolic panel, and lipase.  Patient has no white count, no LFT or lipase elevation, and no electrolyte derangement.  Urinalysis is clear and pregnancy test is negative.  CT scan of the abdomen and pelvis with renal protocol was obtained showing possible small bowel obstruction in the pelvis.  It was recommended the study be repeated with contrast so this was also performed.  This shows thickening of the distal ileum, but no obstruction or surgical findings.  Patient has been  hydrated with normal saline and given Toradol for pain.  She seems to be feeling better.  At this point, the cause of the distal ileum abnormalities is uncertain, but most likely either infectious or inflammatory.  Patient to be treated with Augmentin and will be discharged with continued use of Tylenol and ibuprofen.  She will be referred to gastroenterology.  Final Clinical Impression(s) / ED Diagnoses Final diagnoses:  None    Rx / DC Orders ED Discharge Orders     None         Geoffery Lyons, MD 09/11/22 [phone]

## 2022-09-11 NOTE — ED Notes (Signed)
Reviewed AVS with patient, patient expressed understanding of directions, denies further questions at this time. 

## 2022-09-11 NOTE — ED Notes (Signed)
Pt to CT by wheelchair

## 2022-09-11 NOTE — ED Notes (Signed)
Patient resting quietly in stretcher, respirations even, unlabored, no acute distress noted. Denies needs at this time.  

## 2022-09-11 NOTE — Discharge Instructions (Addendum)
Begin taking Augmentin as prescribed.  Take Tylenol 1000 mg rotated with ibuprofen 600 mg every 4 hours as needed for pain.  Follow-up with gastroenterology in the next few days.  The contact information for [facility] has been provided in this discharge summary for you to call and make these arrangements.

## 2022-09-11 NOTE — ED Triage Notes (Signed)
LLQ pain x 2 day, +n/v  Intermittent sharp stabbing pain

## 2022-09-19 DIAGNOSIS — R933 Abnormal findings on diagnostic imaging of other parts of digestive tract: Secondary | ICD-10-CM | POA: Diagnosis not present

## 2022-09-19 DIAGNOSIS — E669 Obesity, unspecified: Secondary | ICD-10-CM | POA: Diagnosis not present

## 2022-09-19 DIAGNOSIS — K529 Noninfective gastroenteritis and colitis, unspecified: Secondary | ICD-10-CM | POA: Diagnosis not present

## 2022-12-26 DIAGNOSIS — I10 Essential (primary) hypertension: Secondary | ICD-10-CM | POA: Diagnosis not present

## 2022-12-26 DIAGNOSIS — E049 Nontoxic goiter, unspecified: Secondary | ICD-10-CM | POA: Diagnosis not present

## 2022-12-26 DIAGNOSIS — F3341 Major depressive disorder, recurrent, in partial remission: Secondary | ICD-10-CM | POA: Diagnosis not present

## 2022-12-26 DIAGNOSIS — F909 Attention-deficit hyperactivity disorder, unspecified type: Secondary | ICD-10-CM | POA: Diagnosis not present

## 2023-07-27 DIAGNOSIS — E559 Vitamin D deficiency, unspecified: Secondary | ICD-10-CM | POA: Diagnosis not present

## 2023-07-27 DIAGNOSIS — Z0000 Encounter for general adult medical examination without abnormal findings: Secondary | ICD-10-CM | POA: Diagnosis not present

## 2023-07-27 DIAGNOSIS — F3341 Major depressive disorder, recurrent, in partial remission: Secondary | ICD-10-CM | POA: Diagnosis not present

## 2023-07-27 DIAGNOSIS — I10 Essential (primary) hypertension: Secondary | ICD-10-CM | POA: Diagnosis not present

## 2023-07-27 DIAGNOSIS — Z111 Encounter for screening for respiratory tuberculosis: Secondary | ICD-10-CM | POA: Diagnosis not present

## 2023-07-27 DIAGNOSIS — Z789 Other specified health status: Secondary | ICD-10-CM | POA: Diagnosis not present

## 2023-08-04 IMAGING — CT CT HEART MORP W/ CTA COR W/ SCORE W/ CA W/CM &/OR W/O CM
4 of 7 series · 8 of 20 positions shown, 9 images · IV contrast (APPLIED)
Comparison: None.
COMPARISON: None.

Addendum:
EXAM:
OVER-READ INTERPRETATION  CT CHEST

The following report is an over-read performed by radiologist Dr.
Nina Stedman [[email or web address]] on 07/27/2021. This
over-read does not include interpretation of cardiac or coronary
anatomy or pathology. The coronary calcium score/coronary CTA
interpretation by the cardiologist is attached.
CLINICAL DATA: Chest pain
Cardiac/Coronary  CTA
TECHNIQUE: The patient was scanned on a [employer] scanner.

[Series 7: ts diast sharp · axial · 0.39mm/px · z∈[+1363,+1402]mm · 2 of 291 slices shown]
[im 97/291  lung]
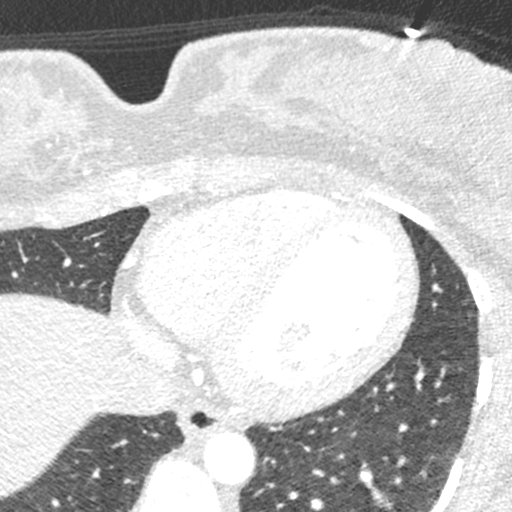
[im 194/291  lung]
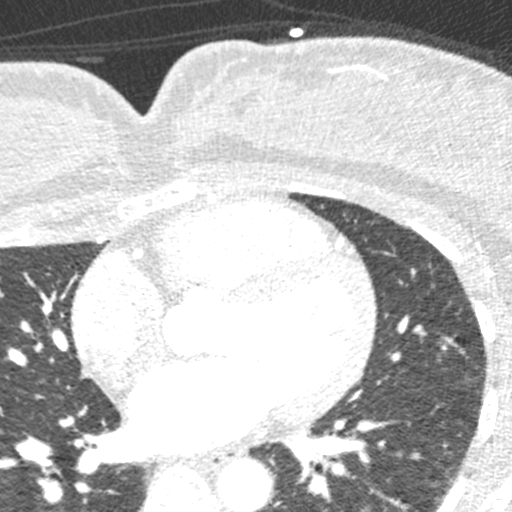

[Series 8: ts syst sharp · axial · 0.39mm/px · z∈[+1363,+1402]mm · 2 of 291 slices shown]
[im 97/291  lung]
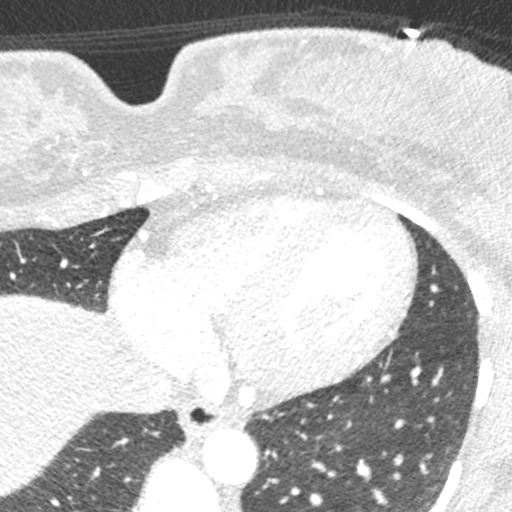
[im 194/291  lung]
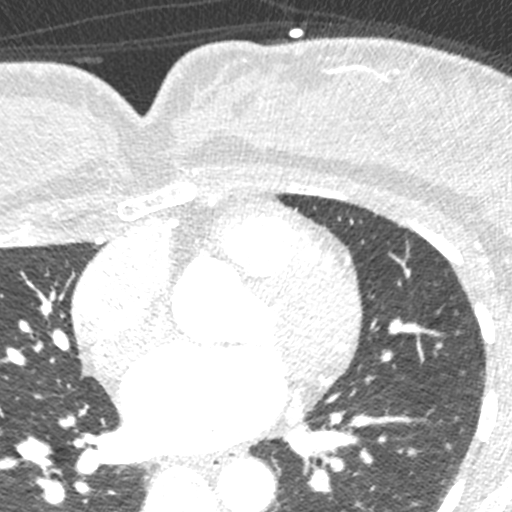

[Series 9: best syst · axial · 0.39mm/px · z∈[+1363,+1402]mm · 2 of 291 slices shown, 3 images]
[im 97/291  vessel]
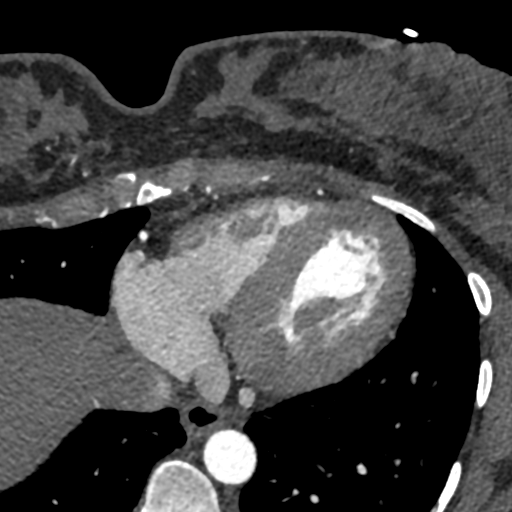
[im 97/291  lung]
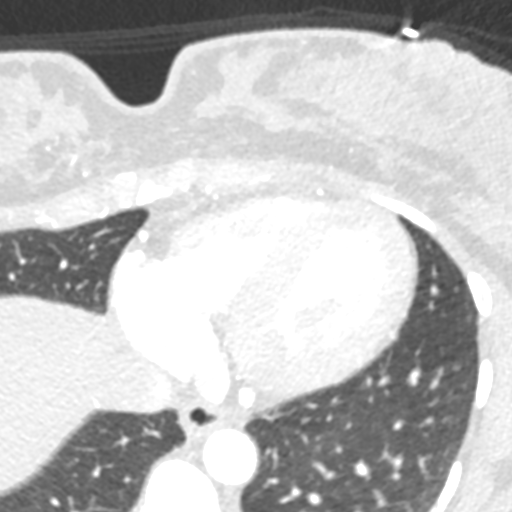
[im 194/291  vessel]
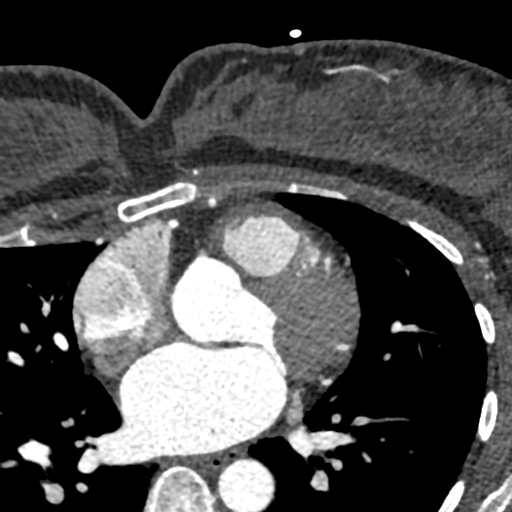

[Series 10: best diast · axial · 0.39mm/px · z∈[+1363,+1402]mm · 2 of 291 slices shown]
[im 97/291  vessel]
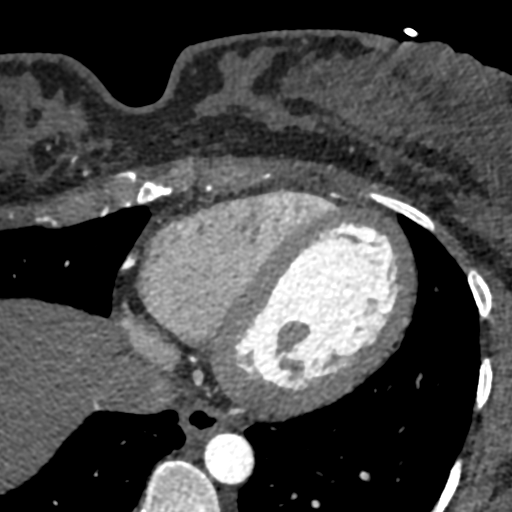
[im 194/291  vessel]
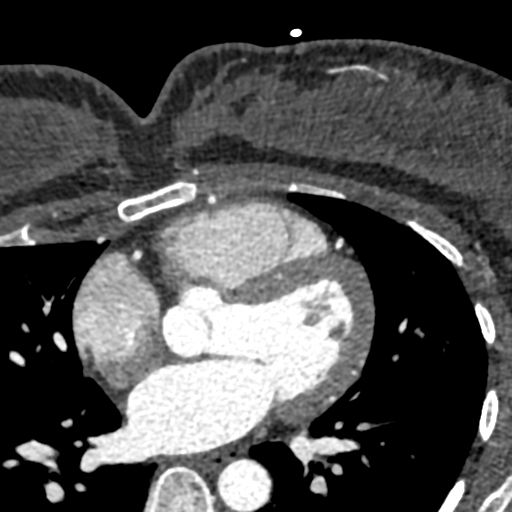

[8 of 20 positions shown; findings below may reference images not displayed]

FINDINGS: Vascular: None.

Mediastinum/Nodes: None.

Lungs/Pleura: Trace bilateral pleural effusions.

Upper Abdomen: None.

Musculoskeletal: None.
IMPRESSION: Trace bilateral pleural effusions.
FINDINGS: A 100 kV prospective scan was triggered in the descending thoracic
aorta at 111 HU's. Axial non-contrast 3 mm slices were carried out
through the heart. The data set was analyzed on a dedicated work
station and scored using the Agatson method. Gantry rotation speed
was 250 msecs and collimation was .6 mm. 0.8 mg of sl NTG was given.
The 3D data set was reconstructed in 5% intervals of the 67-82 % of
the R-R cycle. Diastolic phases were analyzed on a dedicated work
station using MPR, MIP and VRT modes. The patient received 80 cc of
contrast.

Image quality: good

Aorta:  Normal size.  No calcifications.  No dissection.

Aortic Valve:  Trileaflet.  No calcifications.

Coronary Arteries:  Normal coronary origin.  Right dominance.

RCA is a large dominant artery that gives rise to PDA and PLA. There
is no plaque.

Left main is a large artery that gives rise to LAD and LCX arteries.

LAD is a large vessel that has no plaque.

LCX is a non-dominant artery that gives rise to one large OM1
branch. There is no plaque.

Other findings:

Normal pulmonary vein drainage into the left atrium.

Normal left atrial appendage without a thrombus.

Normal size of the pulmonary artery.

Please see radiology report for non cardiac findings.
IMPRESSION: 1. Coronary calcium score of 0. This was 0 percentile for age and
sex matched control.

2. Normal coronary origin with right dominance.

3. No evidence of CAD.

CAD-RADS 0. No evidence of CAD (0%). Consider non-atherosclerotic
causes of chest pain.

*** End of Addendum ***
EXAM:
OVER-READ INTERPRETATION  CT CHEST

The following report is an over-read performed by radiologist Dr.
Nina Stedman [[email or web address]] on 07/27/2021. This
over-read does not include interpretation of cardiac or coronary
anatomy or pathology. The coronary calcium score/coronary CTA
interpretation by the cardiologist is attached.
FINDINGS: Vascular: None.

Mediastinum/Nodes: None.

Lungs/Pleura: Trace bilateral pleural effusions.

Upper Abdomen: None.

Musculoskeletal: None.
IMPRESSION: Trace bilateral pleural effusions.

## 2023-11-28 DIAGNOSIS — E041 Nontoxic single thyroid nodule: Secondary | ICD-10-CM | POA: Diagnosis not present

## 2023-11-28 DIAGNOSIS — E049 Nontoxic goiter, unspecified: Secondary | ICD-10-CM | POA: Diagnosis not present

## 2024-02-26 ENCOUNTER — Emergency Department (HOSPITAL_BASED_OUTPATIENT_CLINIC_OR_DEPARTMENT_OTHER)
Admission: EM | Admit: 2024-02-26 | Discharge: 2024-02-26 | Disposition: A | Discharge status: Home / Self Care | Attending: Emergency Medicine | Admitting: Emergency Medicine

## 2024-02-26 ENCOUNTER — Other Ambulatory Visit: Payer: Self-pay

## 2024-02-26 ENCOUNTER — Emergency Department (HOSPITAL_BASED_OUTPATIENT_CLINIC_OR_DEPARTMENT_OTHER)

## 2024-02-26 DIAGNOSIS — R202 Paresthesia of skin: Secondary | ICD-10-CM | POA: Insufficient documentation

## 2024-02-26 DIAGNOSIS — I10 Essential (primary) hypertension: Secondary | ICD-10-CM | POA: Insufficient documentation

## 2024-02-26 DIAGNOSIS — Z79899 Other long term (current) drug therapy: Secondary | ICD-10-CM | POA: Diagnosis not present

## 2024-02-26 LAB — DIFFERENTIAL
Abs Immature Granulocytes: 0.01 K/uL (ref 0.00–0.07)
Basophils Absolute: 0 K/uL (ref 0.0–0.1)
Basophils Relative: 0 %
Eosinophils Absolute: 0.1 K/uL (ref 0.0–0.5)
Eosinophils Relative: 1 %
Immature Granulocytes: 0 %
Lymphocytes Relative: 34 %
Lymphs Abs: 2.7 K/uL (ref 0.7–4.0)
Monocytes Absolute: 0.5 K/uL (ref 0.1–1.0)
Monocytes Relative: 6 %
Neutro Abs: 4.7 K/uL (ref 1.7–7.7)
Neutrophils Relative %: 59 %

## 2024-02-26 LAB — COMPREHENSIVE METABOLIC PANEL WITH GFR
ALT: 10 U/L (ref 0–44)
AST: 17 U/L (ref 15–41)
Albumin: 4.9 g/dL (ref 3.5–5.0)
Alkaline Phosphatase: 60 U/L (ref 38–126)
Anion gap: 11 (ref 5–15)
BUN: 10 mg/dL (ref 6–20)
CO2: 24 mmol/L (ref 22–32)
Calcium: 10.2 mg/dL (ref 8.9–10.3)
Chloride: 102 mmol/L (ref 98–111)
Creatinine, Ser: 0.53 mg/dL (ref 0.44–1.00)
GFR, Estimated: >60 mL/min (ref >60)
Glucose, Bld: 93 mg/dL (ref 70–99)
Potassium: 3.6 mmol/L (ref 3.5–5.1)
Sodium: 138 mmol/L (ref 135–145)
Total Bilirubin: 0.9 mg/dL (ref 0.0–1.2)
Total Protein: 8.3 g/dL — ABNORMAL HIGH (ref 6.5–8.1)

## 2024-02-26 LAB — CBC
HCT: 37.7 % (ref 36.0–46.0)
Hemoglobin: 12.2 g/dL (ref 12.0–15.0)
MCH: 26.2 pg (ref 26.0–34.0)
MCHC: 32.4 g/dL (ref 30.0–36.0)
MCV: 81.1 fL (ref 80.0–100.0)
Platelets: 367 K/uL (ref 150–400)
RBC: 4.65 MIL/uL (ref 3.87–5.11)
RDW: 13.4 % (ref 11.5–15.5)
WBC: 8 K/uL (ref 4.0–10.5)
nRBC: 0 % (ref 0.0–0.2)

## 2024-02-26 LAB — PROTIME-INR
INR: 0.9 (ref 0.8–1.2)
Prothrombin Time: 13.2 s (ref 11.4–15.2)

## 2024-02-26 LAB — CBG MONITORING, ED: Glucose-Capillary: 95 mg/dL (ref 70–99)

## 2024-02-26 LAB — ETHANOL: Alcohol, Ethyl (B): <15 mg/dL (ref <15)

## 2024-02-26 LAB — HCG, SERUM, QUALITATIVE: Preg, Serum: NEGATIVE

## 2024-02-26 LAB — APTT: aPTT: 27 s (ref 24–36)

## 2024-02-26 MED ORDER — SODIUM CHLORIDE 0.9% FLUSH: 3.0000 mL | Freq: Once | INTRAVENOUS | Status: DC

## 2024-02-26 NOTE — Discharge Instructions (Addendum)
 It was a pleasure taking care of you today. You were seen in the Emergency Department for left leg and face tingling. Your work-up was reassuring. Your CT/Xray/Labs showed no acute abnormality to explain your symptoms.  I do recommend that you follow-up outpatient to obtain an MRI, especially if your symptoms return. Refer to the attached documentation for further management of your symptoms. Follow up with your PCP for MRI referral if your symptoms continue.  Please return to the ER if you experience chest pain, trouble breathing, intractable nausea/vomiting or any other life threatening illnesses.

## 2024-02-26 NOTE — ED Triage Notes (Addendum)
 Reports left leg and left side of face numbness since Sunday. States facial numbness has been intermittent but leg numbness has been constant.   Reports headache started right before numbness symptoms started. No hx of migraines.

## 2024-02-26 NOTE — ED Provider Notes (Signed)
 [facility] AT [facility] Provider Note   CSN: [id number] Arrival date & time: 02/26/24  1618     Patient presents with: Numbness   [name] is a 33 y.o. female with past medical history of hypertension, who presents emergency department for evaluation of a left-sided numbness.  Patient reports that on Saturday she had a headache, and starting on Sunday she began to report left leg numbness.  She described it as paresthesias.  She stated that she has also had some numbness on her face that has been intermittent since that time.  Patient was also on the phone with her supervisor earlier today, when she was reportedly having difficulty with word finding.  Patient states she feels like she is back at her baseline.  Patient denies any history of migraines, fevers, body aches headache at this time, dizziness or lightheadedness.  She denies any chest pain, shortness of breath abdominal pain, nausea or vomiting.   HPI     Prior to Admission medications   Medication Sig Start Date End Date Taking? Authorizing Provider  amoxicillin -clavulanate (AUGMENTIN ) 500-125 MG tablet Take 1 tablet by mouth every 8 (eight) hours. 09/11/22   Geroldine Berg, MD  labetalol  (NORMODYNE ) 200 MG tablet Take 1 tablet by mouth twice daily 11/22/21   Raford Riggs, MD  NIFEdipine  (PROCARDIA -XL/NIFEDICAL-XL) 30 MG 24 hr tablet Take 1 tablet (30 mg total) by mouth daily. 09/01/21   Vannie Reche RAMAN, NP  sertraline  (ZOLOFT ) 25 MG tablet Take 25 mg by mouth daily.    [provider]    Allergies: Patient has no known allergies.    Review of Systems  Neurological:  Positive for numbness.    Updated Vital Signs BP 123/81   Pulse 72   Temp 97.8 F (36.6 C)   Resp 14   SpO2 100%   Physical Exam Vitals and nursing note reviewed.  Constitutional:      Appearance: Normal appearance.  HENT:     Head: Normocephalic and atraumatic.     Right Ear: There is impacted cerumen.      Left Ear: There is impacted cerumen.     Mouth/Throat:     Mouth: Mucous membranes are moist.  Eyes:     General: No scleral icterus.       Right eye: No discharge.        Left eye: No discharge.     Conjunctiva/sclera: Conjunctivae normal.  Cardiovascular:     Rate and Rhythm: Normal rate and regular rhythm.     Pulses: Normal pulses.  Pulmonary:     Effort: Pulmonary effort is normal.     Breath sounds: Normal breath sounds.  Abdominal:     General: There is no distension.     Tenderness: There is no abdominal tenderness.  Musculoskeletal:        General: No swelling, tenderness or deformity. Normal range of motion.     Cervical back: Normal range of motion.     Comments: No spinal tenderness noted.  Strength is 5 out of 5 bilaterally on upper and lower extremities.  Patient is able to range of motion her extremities without difficulty.  Her sensation is intact bilaterally on her lower extremities, upper extremities and face.  Skin:    General: Skin is warm and dry.     Capillary Refill: Capillary refill takes less than 2 seconds.  Neurological:     Mental Status: She is alert.     Motor: No weakness.  Comments: Patient speaks in full goal oriented sentences. Cranial nerves 3-12 grossly intact. DTRs normal and symmetric. Equal grip strength bilateral with 5/5 strength against resistance in upper and lower extremities. No sensory or motor deficits appreciated.   Psychiatric:        Mood and Affect: Mood normal.     (all labs ordered are listed, but only abnormal results are displayed) Labs Reviewed  COMPREHENSIVE METABOLIC PANEL WITH GFR - Abnormal; Notable for the following components:      Result Value   Total Protein 8.3 (*)    All other components within normal limits  PROTIME-INR  APTT  CBC  DIFFERENTIAL  ETHANOL  HCG, SERUM, QUALITATIVE  CBG MONITORING, ED    EKG: None  Radiology: CT Head Wo Contrast Result Date: 02/26/2024 EXAM: CT HEAD WITHOUT  02/26/2024 06:32:37 PM TECHNIQUE: CT of the head was performed without the administration of intravenous contrast. Automated exposure control, iterative reconstruction, and/or weight based adjustment of the mA/kV was utilized to reduce the radiation dose to as low as reasonably achievable. COMPARISON: None available. CLINICAL HISTORY: parasthesia FINDINGS: BRAIN AND VENTRICLES: No acute intracranial hemorrhage. No mass effect or midline shift. No extra-axial fluid collection. No evidence of acute infarct. No hydrocephalus. ORBITS: No acute abnormality. SINUSES AND MASTOIDS: No acute abnormality. SOFT TISSUES AND SKULL: No acute skull fracture. No acute soft tissue abnormality. IMPRESSION: 1. No acute intracranial abnormality. Electronically signed by: Donnice Mania MD 02/26/2024 06:55 PM EST RP Workstation: [id number]     Procedures   Medications Ordered in the ED  sodium chloride  flush (NS) 0.9 % injection 3 mL ( Intravenous Canceled Entry 02/26/24 1632)                                   Medical Decision Making Amount and/or Complexity of Data Reviewed Labs: ordered. Radiology: ordered.   This patient presents to the ED for concern of numbness in the lower extremity and face, this involves an extensive number of treatment options, and is a complaint that carries with it a high risk of complications and morbidity.   Differential diagnosis includes: Diabetic neuropathy, radiculopathy, spinal injury, medication side effect, trigeminal neuralgia, IIH, stroke  Co morbidities:  hypertension  Lab Tests:  I Ordered, and personally interpreted labs.  The pertinent results include:  no acute abnormalities  Imaging Studies:  I ordered imaging studies including CT head I independently visualized and interpreted imaging which showed no acute intracranial abnormalities I agree with the radiologist interpretation  Cardiac Monitoring/ECG:  The patient was maintained on a cardiac monitor.  I  personally viewed and interpreted the cardiac monitored which showed an underlying rhythm of: sinus rhythm  Medicines ordered and prescription drug management:  I ordered medication including  Medications  sodium chloride  flush (NS) 0.9 % injection 3 mL ( Intravenous Canceled Entry 02/26/24 1632)   Medication is not indicated at this time  I have reviewed the patients home medicines and have made adjustments as needed  Test Considered:  none  Critical Interventions:  none  Consultations Obtained: None  Problem List / ED Course:     ICD-10-CM   1. Paresthesia of left leg  R20.2       MDM: 33 year old female who presents emergency department for evaluation of numbness on her left lower extremity and face.  Numbness began on her left lower extremity on Sunday after she had a headache on Saturday.  Headache was relieved with 2 naproxen.  Patient does not have a history of prior migraines.  Patient has had constant numbness, described as paresthesias on her left lower extremity.  Her physical exam revealed no sensory deficits on her bilateral lower extremities, upper extremities or face.  Her strength is intact and 5 out of 5 bilaterally on the upper and lower extremities.  Patient is nontender in her vertebrae throughout.  Patient does not have a history of diabetes, so this is unlikely diabetic neuropathy.  Patient's lab work is unremarkable.  Patient's colleague is at bedside, who also stated that the patient had a 30-minute episode where she was having difficulty with word finding.  Due to this, I did order a CT head.  Her CT head was unremarkable and showed no evidence of intracranial abnormality.  I did inform the patient that it would be beneficial for her to get an MRI for a complete workup, however stroke is a low differential at this time.  I informed the patient that we do not currently have MRI Ability at [facility] and she would have to go to [facility] if she wanted to obtain  this imaging.  At this time, patient declined MRI.  She stated she felt comfortable being discharged and following up with her PCP outpatient to obtain MRI imaging.  Patient's vital signs are stable.  Patient is appropriate for discharge at this time.  Dispostion:  After consideration of the diagnostic results and the patients response to treatment, I feel that the patient would benefit from supportive care and PCP follow-up for further workup.   Final diagnoses:  Paresthesia of left leg    ED Discharge Orders     None          Torrence Marry RAMAN, PA-C 02/26/24 2140    Pamella Ozell LABOR, DO 03/05/24 [phone]

## 2024-02-26 NOTE — ED Notes (Signed)
 Lab called to add on qual HCG

## 2024-03-04 DIAGNOSIS — I10 Essential (primary) hypertension: Secondary | ICD-10-CM | POA: Diagnosis not present

## 2024-03-04 DIAGNOSIS — F909 Attention-deficit hyperactivity disorder, unspecified type: Secondary | ICD-10-CM | POA: Diagnosis not present

## 2024-03-04 DIAGNOSIS — F3341 Major depressive disorder, recurrent, in partial remission: Secondary | ICD-10-CM | POA: Diagnosis not present
